# Patient Record
Sex: Female | Born: 1965 | Race: White | Hispanic: No | Marital: Single
Health system: Midwestern US, Academic
[De-identification: ages and names within clinical notes are randomized; demographics above are authoritative.]

---

## 2016-12-30 MED ORDER — OXYCODONE 5 MG PO TAB
5 mg | ORAL_TABLET | ORAL | 0 refills | PRN
Start: 2016-12-30 — End: ?

## 2016-12-30 MED ORDER — CLONAZEPAM 1 MG PO TAB
1 mg | ORAL_TABLET | Freq: Two times a day (BID) | ORAL | 0 refills | PRN
Start: 2016-12-30 — End: ?

## 2017-01-14 MED ORDER — CLONAZEPAM 1 MG PO TAB
1 mg | ORAL_TABLET | Freq: Two times a day (BID) | ORAL | 0 refills | PRN
Start: 2017-01-14 — End: ?

## 2017-01-14 MED ORDER — OXYCODONE 5 MG PO TAB
5 mg | ORAL_TABLET | ORAL | 0 refills | PRN
Start: 2017-01-14 — End: ?

## 2017-01-19 MED ORDER — CLONAZEPAM 1 MG PO TAB
1 mg | ORAL_TABLET | Freq: Two times a day (BID) | ORAL | 0 refills | Status: DC | PRN
Start: 2017-01-19 — End: 2017-03-11

## 2017-01-19 MED ORDER — FLUTICASONE 50 MCG/ACTUATION NA SPSN
1 | Freq: Two times a day (BID) | NASAL | 3 refills | 60.00000 days | Status: DC
Start: 2017-01-19 — End: 2017-02-08

## 2017-01-19 MED ORDER — OXYCODONE 5 MG PO TAB
5 mg | ORAL_TABLET | ORAL | 0 refills | 6.00000 days | Status: DC | PRN
Start: 2017-01-19 — End: 2017-07-07

## 2017-01-19 MED ORDER — OXYCODONE 5 MG PO TAB
5 mg | ORAL_TABLET | ORAL | 0 refills | 6.00000 days | Status: DC | PRN
Start: 2017-01-19 — End: 2017-01-19

## 2017-01-19 MED ORDER — DOXYCYCLINE HYCLATE 100 MG PO TAB
100 mg | ORAL_TABLET | Freq: Two times a day (BID) | ORAL | 0 refills | 8.00000 days | Status: AC
Start: 2017-01-19 — End: ?

## 2017-02-02 MED ORDER — LISINOPRIL-HYDROCHLOROTHIAZIDE 20-12.5 MG PO TAB
ORAL_TABLET | Freq: Every day | ORAL | 1 refills | 30.00000 days | Status: DC
Start: 2017-02-02 — End: 2017-05-03

## 2017-02-08 MED ORDER — AMITRIPTYLINE 25 MG PO TAB
ORAL_TABLET | 1 refills | Status: DC
Start: 2017-02-08 — End: 2017-05-05

## 2017-02-08 MED ORDER — AMITRIPTYLINE 50 MG PO TAB
ORAL_TABLET | 1 refills | Status: DC
Start: 2017-02-08 — End: 2017-05-05

## 2017-03-02 ENCOUNTER — Encounter: Admit: 2017-03-02 | Discharge: 2017-03-02

## 2017-03-02 NOTE — Telephone Encounter
VM left on pt's phone to call back regarding her hypotension.

## 2017-03-07 ENCOUNTER — Encounter: Admit: 2017-03-07 | Discharge: 2017-03-07

## 2017-03-07 NOTE — Telephone Encounter
Phone call to [facility] emergency line. Patient reports that she has had elevated blood pressures today with a high of 170/90. She had stopped taking her BP meds entirely over the past 4 days due to BPs consistently at goal when evaluated in office but has now taken them this morning after seeing her BP. No symptoms at this time.    Recommended continuation of BP meds as previously prescribed and follow-up with PCP in 1-2 weeks for dose adjustment.    Laray Anger MD  Family Medicine PGY3  Pager [phone]

## 2017-03-09 ENCOUNTER — Encounter: Admit: 2017-03-09 | Discharge: 2017-03-09

## 2017-03-09 DIAGNOSIS — F419 Anxiety disorder, unspecified: Principal | ICD-10-CM

## 2017-03-09 MED ORDER — CLONAZEPAM 1 MG PO TAB
ORAL_TABLET | Freq: Two times a day (BID) | 0 refills | Status: CN | PRN
Start: 2017-03-09 — End: ?

## 2017-03-09 NOTE — Telephone Encounter
Pt was last seen 01/19/2017.

## 2017-03-11 ENCOUNTER — Encounter: Admit: 2017-03-11 | Discharge: 2017-03-11

## 2017-03-11 DIAGNOSIS — F419 Anxiety disorder, unspecified: Principal | ICD-10-CM

## 2017-03-11 MED ORDER — CLONAZEPAM 1 MG PO TAB
1 mg | ORAL_TABLET | Freq: Two times a day (BID) | ORAL | 0 refills | Status: AC | PRN
Start: 2017-03-11 — End: 2017-05-05

## 2017-03-24 ENCOUNTER — Encounter: Admit: 2017-03-24 | Discharge: 2017-03-24

## 2017-03-24 NOTE — Telephone Encounter
Called to inform pt that orders were on file for a mammo. No answer, left number to imaging dept if pt wants to schedule an appt.

## 2017-03-25 ENCOUNTER — Encounter: Admit: 2017-03-25 | Discharge: 2017-03-25

## 2017-03-26 MED ORDER — PANTOPRAZOLE 40 MG PO TBEC
40 mg | ORAL_TABLET | Freq: Every day | ORAL | 0 refills | 90.00000 days | Status: AC
Start: 2017-03-26 — End: 2017-10-21

## 2017-04-02 ENCOUNTER — Encounter: Admit: 2017-04-02 | Discharge: 2017-04-02

## 2017-04-02 DIAGNOSIS — E119 Type 2 diabetes mellitus without complications: Principal | ICD-10-CM

## 2017-04-02 MED ORDER — GLIMEPIRIDE 2 MG PO TAB
2 mg | ORAL_TABLET | Freq: Two times a day (BID) | ORAL | 1 refills | Status: AC
Start: 2017-04-02 — End: 2017-04-19

## 2017-04-02 MED ORDER — GLIMEPIRIDE 1 MG PO TAB
2 mg | ORAL_TABLET | Freq: Two times a day (BID) | ORAL | 1 refills | Status: AC
Start: 2017-04-02 — End: 2017-04-02

## 2017-04-02 NOTE — Telephone Encounter
Pharmacy requesting to change glimepiride 1mg  (take 2 BID) to glimepiride 2mg  (take 1 BID).    New RX sent.

## 2017-04-02 NOTE — Telephone Encounter
Pt was last seen 01/19/17. Last physical was on 07/22/16. Sent medication to pharmacy with a 90 day supply and 1 refill.

## 2017-04-19 ENCOUNTER — Ambulatory Visit: Admit: 2017-04-19 | Discharge: 2017-04-20 | Payer: MEDICAID

## 2017-04-19 ENCOUNTER — Encounter: Admit: 2017-04-19 | Discharge: 2017-04-19

## 2017-04-19 DIAGNOSIS — A64 Unspecified sexually transmitted disease: ICD-10-CM

## 2017-04-19 DIAGNOSIS — C539 Malignant neoplasm of cervix uteri, unspecified: ICD-10-CM

## 2017-04-19 DIAGNOSIS — M25559 Pain in unspecified hip: ICD-10-CM

## 2017-04-19 DIAGNOSIS — M1990 Unspecified osteoarthritis, unspecified site: ICD-10-CM

## 2017-04-19 DIAGNOSIS — R51 Headache: ICD-10-CM

## 2017-04-19 DIAGNOSIS — J309 Allergic rhinitis, unspecified: ICD-10-CM

## 2017-04-19 DIAGNOSIS — F411 Generalized anxiety disorder: ICD-10-CM

## 2017-04-19 DIAGNOSIS — R52 Pain, unspecified: ICD-10-CM

## 2017-04-19 DIAGNOSIS — M542 Cervicalgia: ICD-10-CM

## 2017-04-19 DIAGNOSIS — I10 Essential (primary) hypertension: Principal | ICD-10-CM

## 2017-04-19 DIAGNOSIS — F429 Obsessive-compulsive disorder, unspecified: ICD-10-CM

## 2017-04-19 DIAGNOSIS — E785 Hyperlipidemia, unspecified: ICD-10-CM

## 2017-04-19 DIAGNOSIS — G4733 Obstructive sleep apnea (adult) (pediatric): ICD-10-CM

## 2017-04-19 DIAGNOSIS — K219 Gastro-esophageal reflux disease without esophagitis: ICD-10-CM

## 2017-04-19 DIAGNOSIS — E119 Type 2 diabetes mellitus without complications: ICD-10-CM

## 2017-04-19 DIAGNOSIS — F329 Major depressive disorder, single episode, unspecified: ICD-10-CM

## 2017-04-19 DIAGNOSIS — F419 Anxiety disorder, unspecified: ICD-10-CM

## 2017-04-19 DIAGNOSIS — T148XXA Other injury of unspecified body region, initial encounter: ICD-10-CM

## 2017-04-19 DIAGNOSIS — M5030 Other cervical disc degeneration, unspecified cervical region: ICD-10-CM

## 2017-04-19 DIAGNOSIS — F410 Panic disorder [episodic paroxysmal anxiety] without agoraphobia: ICD-10-CM

## 2017-04-19 DIAGNOSIS — F332 Major depressive disorder, recurrent severe without psychotic features: ICD-10-CM

## 2017-04-19 DIAGNOSIS — L409 Psoriasis, unspecified: Principal | ICD-10-CM

## 2017-04-19 MED ORDER — GLIMEPIRIDE 4 MG PO TAB
4 mg | ORAL_TABLET | Freq: Every day | ORAL | 1 refills | Status: AC
Start: 2017-04-19 — End: 2017-07-07

## 2017-04-19 MED ORDER — CETIRIZINE 10 MG PO TAB
10 mg | ORAL_TABLET | Freq: Every morning | ORAL | 3 refills | Status: AC
Start: 2017-04-19 — End: 2018-08-01

## 2017-04-19 NOTE — Progress Notes
Date of Service: 04/19/2017    Jessica Colon is a 51 y.o. female.    Subjective:             History of Present Illness    Chief Complaint   Patient presents with   ??? Hypertension     follow up     1. HTN  -had stressful commute on the way to appt today  -takes lisinopril 20/12.5 mg daily. Was taking 2 pills daily but BP was running low    2. DM2  -has not been adhering to DM diet lately  -has been walking more  A1c 8.7 today  -currently taking glimepiride 3 mg daily. Has not been taking meds regularly  -did not tolerate MTF    3. Depression/anxiety  -has had increase in anxiety attacks  -followed by psychiatry  -has appt in 1 mo    4. Neck pain  -causing tingling in bilateral hands  -MRI c-spine: severe cervical stenosis. S/p cervical spine surgery  -takes cymbalta 120 mg daily    5. Allergic rhinitis  -has not tried allergy meds         Review of Systems   HENT: Positive for congestion, postnasal drip and rhinorrhea. Negative for sinus pain and sinus pressure.    Respiratory: Positive for cough. Negative for shortness of breath.    Cardiovascular: Negative for chest pain, palpitations and leg swelling.   Endocrine: Negative for polydipsia, polyphagia and polyuria.   Musculoskeletal: Positive for back pain and neck pain. Negative for arthralgias, gait problem, myalgias and neck stiffness.   Neurological: Positive for numbness. Negative for dizziness, weakness, light-headedness and headaches.         Objective:         ??? acetaminophen (TYLENOL) 325 mg tablet Take 2 Tabs by mouth every 4 hours as needed.   ??? amitriptyline (ELAVIL) 25 mg tablet Take 1 tablet in combination with 50mg  for total dose of 75mg  at night   ??? amitriptyline (ELAVIL) 50 mg tablet Take 1 tablet in combination with 25mg  for total dose of 75mg  at night   ??? blood sugar diagnostic test strip Use 1 strip as directed before meals and at bedtime.   ??? Cholecalciferol (Vitamin D3) (VITAMIN D) 1,000 unit cap Take 2 Caps by mouth daily. ??? clobetasol (TEMOVATE) 0.05 % topical cream Apply  topically to affected area twice daily.   ??? clonazePAM (KLONOPIN) 1 mg tablet Take 1 tablet by mouth twice daily as needed (anxiety).   ??? DULoxetine DR (CYMBALTA) 60 mg capsule Take 120 mg by mouth daily.   ??? glimepiride (AMARYL) 2 mg tablet Take 1 tablet by mouth twice daily.   ??? lancets MISC Use 1 each as directed four times daily as needed. Diag: DM2   ??? lisinopril-hydrochlorothiazide (PRINZIDE, ZESTORETIC) 20-12.5 mg tablet TAKE TWO TABLETS BY MOUTH ONCE DAILY   ??? lisinopril/hydrochlorothiazide (ZESTORETIC) 20/12.5 mg tablet Take 2 Tabs by mouth daily.   ??? methocarbamol (ROBAXIN) 500 mg tablet Take 1 Tab by mouth four times daily as needed for Spasms.   ??? naproxen (NAPROSYN) 500 mg tablet Take 1 tablet by mouth twice daily with meals. Take with food.   ??? oxyCODONE (ROXICODONE, OXY-IR) 5 mg tablet Take 1 tablet by mouth every 4 hours as needed for Pain   ??? pantoprazole DR (PROTONIX) 40 mg tablet Take 1 tablet by mouth daily.     Vitals:    04/19/17 1502   BP: (!) 144/92   Pulse: 103  Resp: 16   Temp: 36.8 ???C (98.2 ???F)   TempSrc: Oral   SpO2: 98%   Weight: 103.6 kg (228 lb 4.8 oz)   Height: 170.2 cm (67)     Body mass index is 35.76 kg/m???.     Physical Exam  Constitutional: Alert, well nourished, in no distress.    Eyes:  EOMI.  Conjunctiva are non-icteric and are no injected.  ENT: External ear canals are negative.  TM's are clear bilat, with no erythema.  Oropharynx is clear, with no exudates or ulcers seen.   Sinuses are nontender to palpation bilat.    CV: Heart exam shows regular rhythm, no murmur  RESP: Lungs are clear to auscultation bilat, with no rales, rhonchi, or wheezing.  MS: no joint swelling or erythema.  NEURO:  CN's 2-12 intact; strength is equal bilat. +Hoffman on R hand  PSYCH:  Good eye contact, normal affect.             Assessment and Plan:  Jessica Colon was seen today for hypertension. Diagnoses and all orders for this visit:    Type 2 diabetes mellitus without complication, without long-term current use of insulin (HCC)  -DM out of control  -increase dose of glimepride to 4 mg daily  -encourage DM diet and exercise  -     POC HEMOGLOBIN A1C  -     glimepiride (AMARYL) 4 mg tablet; Take 1 tablet by mouth daily with breakfast.    Psoriasis  -     AMB REFERRAL TO DERMATOLOGY    Neck pain  -pt has h/o spinal stenosis s/p cervical spine surgery  -now experiencing recurrent neck pain/HA and bilateral numbness/tingling  -check CT spine to eval for recurrent stensosis. Refer to ortho spine pending results  -cont cymbalta  -     CT SPINE CERVICAL WO CONTRAST; Future; Expected date: 04/19/2017    Essential hypertension  -BP above goal  -pt advised to cont meds at current dose  -rtc in 4 wks for BP check    Allergic rhinitis, unspecified seasonality, unspecified trigger  -try zyrtec/flonase prn  -     cetirizine (ZYRTEC) 10 mg tablet; Take 1 tablet by mouth every morning.    GAD (generalized anxiety disorder)  -followed by psych  -cont meds per psych recs    Severe episode of recurrent major depressive disorder, without psychotic features (HCC)  -followed by psych  -cont meds per psych recs

## 2017-05-03 ENCOUNTER — Encounter: Admit: 2017-05-03 | Discharge: 2017-05-03

## 2017-05-03 MED ORDER — LISINOPRIL-HYDROCHLOROTHIAZIDE 20-12.5 MG PO TAB
ORAL_TABLET | Freq: Every day | ORAL | 0 refills | 30.00000 days | Status: AC
Start: 2017-05-03 — End: 2017-06-03

## 2017-05-03 NOTE — Telephone Encounter
Pt was last seen 04/19/17. Last physical was on 07/22/16. Sent 90 day supply to pharmacy with no refills.

## 2017-05-04 ENCOUNTER — Encounter: Admit: 2017-05-04 | Discharge: 2017-05-04

## 2017-05-04 DIAGNOSIS — F419 Anxiety disorder, unspecified: Principal | ICD-10-CM

## 2017-05-04 NOTE — Telephone Encounter
Patient called today to reschedule yesterday's missed appointment, states her transportation fell through. Patient states [facility] in [facility] faxed over request for amitiptyline and clonazepam, but they haven't received a response. Follow up scheduled for 8/30.

## 2017-05-05 MED ORDER — CLONAZEPAM 1 MG PO TAB
1 mg | ORAL_TABLET | Freq: Two times a day (BID) | ORAL | 0 refills | Status: AC | PRN
Start: 2017-05-05 — End: 2017-05-27

## 2017-05-05 MED ORDER — AMITRIPTYLINE 25 MG PO TAB
ORAL_TABLET | 0 refills | Status: AC
Start: 2017-05-05 — End: 2017-05-27

## 2017-05-05 MED ORDER — AMITRIPTYLINE 50 MG PO TAB
ORAL_TABLET | 0 refills | Status: AC
Start: 2017-05-05 — End: 2017-05-27

## 2017-05-05 NOTE — Telephone Encounter
Have not received any faxes for this patient for med refills, but will refill given she has appt scheduled. JLK

## 2017-05-17 ENCOUNTER — Ambulatory Visit: Admit: 2017-05-17 | Discharge: 2017-05-18 | Payer: MEDICAID

## 2017-05-17 ENCOUNTER — Encounter: Admit: 2017-05-17 | Discharge: 2017-05-17

## 2017-05-17 ENCOUNTER — Ambulatory Visit: Admit: 2017-05-17 | Discharge: 2017-05-17 | Payer: MEDICAID

## 2017-05-17 DIAGNOSIS — F419 Anxiety disorder, unspecified: ICD-10-CM

## 2017-05-17 DIAGNOSIS — A64 Unspecified sexually transmitted disease: ICD-10-CM

## 2017-05-17 DIAGNOSIS — E119 Type 2 diabetes mellitus without complications: ICD-10-CM

## 2017-05-17 DIAGNOSIS — K219 Gastro-esophageal reflux disease without esophagitis: ICD-10-CM

## 2017-05-17 DIAGNOSIS — K047 Periapical abscess without sinus: ICD-10-CM

## 2017-05-17 DIAGNOSIS — T148XXA Other injury of unspecified body region, initial encounter: ICD-10-CM

## 2017-05-17 DIAGNOSIS — C539 Malignant neoplasm of cervix uteri, unspecified: ICD-10-CM

## 2017-05-17 DIAGNOSIS — M5030 Other cervical disc degeneration, unspecified cervical region: ICD-10-CM

## 2017-05-17 DIAGNOSIS — G4733 Obstructive sleep apnea (adult) (pediatric): ICD-10-CM

## 2017-05-17 DIAGNOSIS — E785 Hyperlipidemia, unspecified: ICD-10-CM

## 2017-05-17 DIAGNOSIS — F410 Panic disorder [episodic paroxysmal anxiety] without agoraphobia: ICD-10-CM

## 2017-05-17 DIAGNOSIS — J0100 Acute maxillary sinusitis, unspecified: Principal | ICD-10-CM

## 2017-05-17 DIAGNOSIS — I10 Essential (primary) hypertension: Principal | ICD-10-CM

## 2017-05-17 DIAGNOSIS — F329 Major depressive disorder, single episode, unspecified: ICD-10-CM

## 2017-05-17 DIAGNOSIS — Z1231 Encounter for screening mammogram for malignant neoplasm of breast: Principal | ICD-10-CM

## 2017-05-17 DIAGNOSIS — F429 Obsessive-compulsive disorder, unspecified: ICD-10-CM

## 2017-05-17 DIAGNOSIS — M25559 Pain in unspecified hip: ICD-10-CM

## 2017-05-17 DIAGNOSIS — M1990 Unspecified osteoarthritis, unspecified site: ICD-10-CM

## 2017-05-17 DIAGNOSIS — R51 Headache: ICD-10-CM

## 2017-05-17 DIAGNOSIS — R52 Pain, unspecified: ICD-10-CM

## 2017-05-17 MED ORDER — DOXYCYCLINE MONOHYDRATE 100 MG PO TAB
100 mg | ORAL_TABLET | Freq: Two times a day (BID) | ORAL | 0 refills | 8.00000 days | Status: AC
Start: 2017-05-17 — End: ?

## 2017-05-17 NOTE — Progress Notes
Date of Service: 05/17/2017    Jessica Colon is a 51 y.o. female.    Subjective:             History of Present Illness    Chief Complaint   Patient presents with   ??? Follow Up     BP   ??? Bronchitis     ?, sore throat, drainage, coughing-since 1 week ago     1. HTN  -BP at goal today  Hypertension Management:  Medication adherent: all of the time    Treatment goal: 140/90  Outside blood pressures being performed: Yes  BP Readings from Last 3 Encounters:   05/17/17 104/78   04/19/17 (!) 144/92   04/06/17 130/90     She denies significant light-headedness.  Imp: Hypertension controlled     Plan:   Discussed hypertension and reviewed goals.  Are barriers to achieving goals present? No  Medication education provided. Patient voiced understanding? Yes  Patient able to self-manage and ready to comply? Yes  Educational resources identified? Verbal Counseling      2. DM2  -has been trying to reduce carbs  Diabetes Management:  Diet adherent: most of the time  Medication adherent: all of the time  Patient is consistent with home glucose monitoring: No  The patient has not had hypoglycemic reactions  Lab Results   Component Value Date/Time    HGBA1C 7.6 (H) 11/11/2016 03:29 PM    HGBA1C 7.9 (H) 08/17/2016 03:54 PM    A1C 8.7 04/19/2017    CHOL 140 08/17/2016 03:54 PM    TRIG 229 (H) 08/17/2016 03:54 PM    HDL 33 (L) 08/17/2016 03:54 PM    LDL 86 08/17/2016 03:54 PM    VLDL 46 08/17/2016 03:54 PM    NONHDLCHOL 107 08/17/2016 03:54 PM    CR 0.93 08/17/2016 03:54 PM    MCALBR <7.0 08/17/2016 04:30 PM       Microalbumin tested in last 12 months?  Yes  Eye exam within the last 12 months? Yes  Comprehensive Foot exam within the last 12 months? Yes  Pneumonia shot current? Yes  The patient is taking a daily aspirin:Not Indicated  The patient is taking an ACE inhibitor or an ARB:Yes   The patient is taking a statin:Yes  Impression: Diabetes - under fair control    Plan: Discussed general issues about diabetes pathophysiology and management.  Discussed exercise management and diet with emphasis on vegetables, fruit and lean meat.  Discussed foot care.  Reminded to get retinal exam annually and dental appointment every 6 months.  Treatment goals: A1C < or = 6.5       BP <140/90  Are barriers to achieving goals present? No  Medication education provided. Patient voiced understanding? Yes  Patient able to self-manage and ready to comply? Yes  Educational resources identified? Verbal Counseling    3. Cough  -+congestion/rhinorrhea/cough productive of yellow/green sputum    4. Tooth infection  -recently had back molars removed  -still has swelling of gums on upper left side         Review of Systems   Constitutional: Positive for fatigue. Negative for chills, diaphoresis and fever.   HENT: Positive for congestion, dental problem, postnasal drip, rhinorrhea and sore throat. Negative for sinus pressure and sneezing.    Respiratory: Positive for cough. Negative for wheezing.    Cardiovascular: Negative for chest pain, palpitations and leg swelling.   Endocrine: Negative for polydipsia, polyphagia and polyuria.  Neurological: Negative for dizziness, light-headedness and headaches.         Objective:         ??? acetaminophen (TYLENOL) 325 mg tablet Take 2 Tabs by mouth every 4 hours as needed.   ??? amitriptyline (ELAVIL) 25 mg tablet Take 1 tablet in combination with 50mg  for total dose of 75mg  at night   ??? amitriptyline (ELAVIL) 50 mg tablet Take 1 tablet in combination with 25mg  for total dose of 75mg  at night   ??? blood sugar diagnostic test strip Use 1 strip as directed before meals and at bedtime.   ??? cetirizine (ZYRTEC) 10 mg tablet Take 1 tablet by mouth every morning.   ??? Cholecalciferol (Vitamin D3) (VITAMIN D) 1,000 unit cap Take 2 Caps by mouth daily.   ??? clobetasol (TEMOVATE) 0.05 % topical cream Apply  topically to affected area twice daily. ??? clonazePAM (KLONOPIN) 1 mg tablet Take 1 tablet by mouth twice daily as needed (anxiety).   ??? DULoxetine DR (CYMBALTA) 60 mg capsule Take 120 mg by mouth daily.   ??? glimepiride (AMARYL) 4 mg tablet Take 1 tablet by mouth daily with breakfast.   ??? lancets MISC Use 1 each as directed four times daily as needed. Diag: DM2   ??? lisinopril-hydrochlorothiazide (PRINZIDE, ZESTORETIC) 20-12.5 mg tablet TAKE 2 TABLETS BY MOUTH ONCE DAILY   ??? lisinopril/hydrochlorothiazide (ZESTORETIC) 20/12.5 mg tablet Take 2 Tabs by mouth daily.   ??? methocarbamol (ROBAXIN) 500 mg tablet Take 1 Tab by mouth four times daily as needed for Spasms.   ??? oxyCODONE (ROXICODONE, OXY-IR) 5 mg tablet Take 1 tablet by mouth every 4 hours as needed for Pain   ??? pantoprazole DR (PROTONIX) 40 mg tablet Take 1 tablet by mouth daily.     Vitals:    05/17/17 1309   BP: 104/78   Pulse: 113   Resp: 16   Temp: 37.7 ???C (99.9 ???F)   SpO2: 97%   Weight: 101.5 kg (223 lb 12.8 oz)   Height: 170.2 cm (67)     Body mass index is 35.05 kg/m???.     Physical Exam  Constitutional: Alert, well nourished, in no distress.    Eyes:  PERRLA, EOMI.  Conjunctiva are non-icteric and are no injected.  ENT: External ear canals are negative.  TM's are clear bilat, with no erythema.  Oropharynx is clear, with no exudates or ulcers seen.   Sinuses are nontender to palpation bilat.  Swelling and erythema to L upper mandible.  Piece of tooth showing at back of mouth  CV: Heart exam shows regular rhythm, no murmur  RESP: +wheezing in LLL and RUL  NEURO:  CN's 2-12 intact  PSYCH:  Good eye contact, normal affect.             Assessment and Plan:  Jessica Colon was seen today for follow up and bronchitis.    Diagnoses and all orders for this visit:    Acute non-recurrent maxillary sinusitis  -tx with doxycyline for sinusitis and potential LRTI  -cont symptomatic management  -return precautions given  -     doxycycline (ADOXA) 100 mg tablet; Take one tablet by mouth twice daily for 10 days.    Tooth infection  -tx with abx as above  -pt advised to f/u with dentist     Essential hypertension  -BP at goal today  -pt advised to take 1 tab daily of lisinpril/HCTZ  -rct in 1 mo for BP check  -consider reducing  dose of meds if BP remains low    Type 2 diabetes mellitus without complication, without long-term current use of insulin (HCC)  -A1c above goal  -pt does not want to increase dose of meds. Will cont working on diet/exercise  -plan to adjust meds at next appt if A1c still above goal

## 2017-05-27 ENCOUNTER — Ambulatory Visit: Admit: 2017-05-27 | Discharge: 2017-05-27 | Payer: MEDICAID

## 2017-05-27 DIAGNOSIS — F411 Generalized anxiety disorder: Principal | ICD-10-CM

## 2017-05-27 DIAGNOSIS — F331 Major depressive disorder, recurrent, moderate: ICD-10-CM

## 2017-05-27 DIAGNOSIS — M542 Cervicalgia: Principal | ICD-10-CM

## 2017-05-27 DIAGNOSIS — F3341 Major depressive disorder, recurrent, in partial remission: ICD-10-CM

## 2017-05-27 MED ORDER — BUSPIRONE 10 MG PO TAB
10 mg | ORAL_TABLET | Freq: Two times a day (BID) | ORAL | 1 refills | Status: AC
Start: 2017-05-27 — End: 2017-07-29

## 2017-05-27 MED ORDER — DULOXETINE 60 MG PO CPDR
120 mg | ORAL_CAPSULE | Freq: Every day | ORAL | 2 refills | 60.00000 days | Status: AC
Start: 2017-05-27 — End: 2017-07-29

## 2017-05-27 MED ORDER — CLONAZEPAM 1 MG PO TAB
ORAL_TABLET | Freq: Every evening | 1 refills | Status: AC
Start: 2017-05-27 — End: 2017-07-29
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
## 2017-05-28 ENCOUNTER — Encounter: Admit: 2017-05-28 | Discharge: 2017-05-28

## 2017-05-28 DIAGNOSIS — 1 ERRONEOUS ENCOUNTER--DISREGARD: Principal | ICD-10-CM

## 2017-06-01 ENCOUNTER — Encounter: Admit: 2017-06-01 | Discharge: 2017-06-01

## 2017-06-01 DIAGNOSIS — M4802 Spinal stenosis, cervical region: Principal | ICD-10-CM

## 2017-06-01 DIAGNOSIS — M4712 Other spondylosis with myelopathy, cervical region: ICD-10-CM

## 2017-06-01 NOTE — Telephone Encounter
Spoke with pt and advised of Ct results and of Dr.Singh's recommendations. Referral process explained to pt and she v/u

## 2017-06-03 ENCOUNTER — Encounter: Admit: 2017-06-03 | Discharge: 2017-06-03

## 2017-06-03 MED ORDER — LISINOPRIL-HYDROCHLOROTHIAZIDE 20-12.5 MG PO TAB
2 | ORAL_TABLET | Freq: Every day | ORAL | 0 refills | 30.00000 days | Status: AC
Start: 2017-06-03 — End: 2017-10-01

## 2017-06-07 ENCOUNTER — Ambulatory Visit: Admit: 2017-06-07 | Discharge: 2017-06-08 | Payer: MEDICAID

## 2017-06-07 ENCOUNTER — Encounter: Admit: 2017-06-07 | Discharge: 2017-06-07

## 2017-06-07 DIAGNOSIS — A64 Unspecified sexually transmitted disease: ICD-10-CM

## 2017-06-07 DIAGNOSIS — E119 Type 2 diabetes mellitus without complications: ICD-10-CM

## 2017-06-07 DIAGNOSIS — G4733 Obstructive sleep apnea (adult) (pediatric): ICD-10-CM

## 2017-06-07 DIAGNOSIS — M5030 Other cervical disc degeneration, unspecified cervical region: ICD-10-CM

## 2017-06-07 DIAGNOSIS — E785 Hyperlipidemia, unspecified: ICD-10-CM

## 2017-06-07 DIAGNOSIS — C539 Malignant neoplasm of cervix uteri, unspecified: ICD-10-CM

## 2017-06-07 DIAGNOSIS — F419 Anxiety disorder, unspecified: ICD-10-CM

## 2017-06-07 DIAGNOSIS — F410 Panic disorder [episodic paroxysmal anxiety] without agoraphobia: ICD-10-CM

## 2017-06-07 DIAGNOSIS — T148XXA Other injury of unspecified body region, initial encounter: ICD-10-CM

## 2017-06-07 DIAGNOSIS — R51 Headache: ICD-10-CM

## 2017-06-07 DIAGNOSIS — K219 Gastro-esophageal reflux disease without esophagitis: ICD-10-CM

## 2017-06-07 DIAGNOSIS — I10 Essential (primary) hypertension: Principal | ICD-10-CM

## 2017-06-07 DIAGNOSIS — M1990 Unspecified osteoarthritis, unspecified site: ICD-10-CM

## 2017-06-07 DIAGNOSIS — F429 Obsessive-compulsive disorder, unspecified: ICD-10-CM

## 2017-06-07 DIAGNOSIS — R52 Pain, unspecified: ICD-10-CM

## 2017-06-07 DIAGNOSIS — M25559 Pain in unspecified hip: ICD-10-CM

## 2017-06-07 DIAGNOSIS — F329 Major depressive disorder, single episode, unspecified: ICD-10-CM

## 2017-06-07 MED ORDER — GABAPENTIN 300 MG PO CAP
ORAL_CAPSULE | Freq: Two times a day (BID) | 3 refills | Status: AC
Start: 2017-06-07 — End: 2017-07-07

## 2017-06-07 NOTE — Telephone Encounter
RN received v/m from patient who stated her daughters have told her that she has "made the wrong decision" and that "she will not be able to do what Dr. Tamala Julian has asked her to do" so she has decided for now "not to do anything". Patient asked for her future appointments to be cancelled for now.

## 2017-06-07 NOTE — Telephone Encounter
Patient's daughter called and would like to speak to you in regards to the patient and her behavior and being serious about getting true help. Please give her a call because she would like to tell you how her mother is acting. Please give her a call. Thank you

## 2017-06-07 NOTE — Progress Notes
[facility]INE [facility] HISTORY AND PHYSICAL    Chief Complaint   Patient presents with   ??? Neck - Pain       Subjective     HISTORY OF PRESENT ILLNESS:  Ms. Jeb Levering is a 51 year old female with history of hypertension, diabetes and cervical cancer, and C5-7 ACDF by Dr. Debroah Loop on 11/18/2015, presents with increasing neck pain with radiation in the right greater than the left upper extremity.  Patient was last seen in July 2016, at which point she was diagnosed with cervical myelopathy and referred to Dr. Debroah Loop.  She underwent ACDF in February 2017.  She reports temporary relief of symptoms.  She reported persistent numbness of the right upper extremity.  She states her pain and numbness have increased.  She does report increasing weakness.  She has had a recent CT scan which demonstrated degenerative changes at C5-C6 and bony neural foraminal stenosis.  VAS pain score is rated as a 5/10.  She denies loss of bowel or bladder function.  She denies balance difficulty.  She denies recent falls.  She is unsure what makes her pain better or worse.  The quality is aching, throbbing, and shooting.  Temporally, symptoms are constant, but fluctuates with intensity.  She has had some physical therapy without significant benefit.  She has tried some medications with temporary relief.         Past Medical History:   Diagnosis Date   ??? Anxiety    ??? Arthritis    ??? 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 Chronic generalized pain    ??? DDD (degenerative disc disease), cervical    ??? Depression     Depression and Anxiety   ??? DM2 (diabetes mellitus, type 2) (HCC)    ??? Generalized headaches    ??? GERD (gastroesophageal reflux disease)    ??? Hip pain    ??? HLD (hyperlipidemia)    ??? HTN (hypertension)    ??? Nerve injury    ??? OCD (obsessive compulsive disorder)    ??? OSA (obstructive sleep apnea)    ??? Panic attacks    ??? Sexually transmitted disease     HX of HPV       Past Surgical History:   Procedure Laterality Date   ??? TONSILLECTOMY  1973 ??? CHOLECYSTECTOMY  1990   ??? TUBAL LIGATION  1991   ??? PR ARTHRD ANT INTERBODY DECOMPRESS CERVICAL BELW C2 N/A 11/18/2015    ANTERIOR CERVICAL 5-7 FUSION performed by Catarina Hartshorn, [facility]   ??? CERVICAL SPINE SURGERY N/A 11/18/2015    VERTEBRECTOMY CERVICAL 6 performed by Catarina Hartshorn, MD at [facility]   ??? HX TOTAL ABDOMINAL HYSTERECTOMY      2/2 cervical ca       family history includes Arthritis in her mother; Cancer in her maternal grandfather; Diabetes in her maternal grandfather; Heart problem in her brother and father; Hypertension in her mother; Joint Pain in her mother; Lung Disease in her brother.    Social History     Social History   ??? Marital status: Single     Spouse name: N/A   ??? Number of children: 2   ??? Years of education: N/A     Occupational History   ??? Not on file.     Social History Main Topics   ??? Smoking status: Current Every Day Smoker     Packs/day: 1.00     Years: 38.00  Types: Cigarettes   ??? Smokeless tobacco: Never Used      Comment: pt declined   ??? Alcohol use No   ??? Drug use: No   ??? Sexual activity: Not Currently     Partners: Male     Birth control/ protection: Surgical      Comment: Hysterectomy     Other Topics Concern   ??? Not on file     Social History Narrative    Lives alone in [place] housing.  No DV.  Feels safe.  Currently not working.  On SSI       Allergies   Allergen Reactions   ??? Pcn [Penicillins] ANAPHYLAXIS     Pt reports it'll kill me, rapid heart rate and I won't breathe.   ??? Clindamycin EDEMA   ??? Hydrocodone SEE COMMENTS     Pt states it makes me feel strange, like I'm out of control of myself.   ??? Niacin SEE COMMENTS     Pt states I get really really hot and I feel strange.   ??? Wellbutrin [Bupropion] ITCHING       Current Outpatient Prescriptions on File Prior to Visit   Medication Sig Dispense Refill   ??? acetaminophen (TYLENOL) 325 mg tablet Take 2 Tabs by mouth every 4 hours as needed.  0 ??? blood sugar diagnostic test strip Use 1 strip as directed before meals and at bedtime. 300 strip 3   ??? busPIRone (BUSPAR) 10 mg tablet Take one tablet by mouth twice daily. 60 tablet 1   ??? cetirizine (ZYRTEC) 10 mg tablet Take 1 tablet by mouth every morning. 30 tablet 3   ??? Cholecalciferol (Vitamin D3) (VITAMIN D) 1,000 unit cap Take 2 Caps by mouth daily.     ??? clobetasol (TEMOVATE) 0.05 % topical cream Apply  topically to affected area twice daily. 60 g 1   ??? clonazePAM (KLONOPIN) 1 mg tablet Take 1 tab at bedtime for sleep. May take additional 1 tab daily for severe anxiety. 60 tablet 1   ??? duloxetine DR (CYMBALTA) 60 mg capsule Take two capsules by mouth daily. 60 capsule 2   ??? glimepiride (AMARYL) 4 mg tablet Take 1 tablet by mouth daily with breakfast. 90 tablet 1   ??? lancets MISC Use 1 each as directed four times daily as needed. Diag: DM2 300 each 11   ??? lisinopril-hydrochlorothiazide (PRINZIDE, ZESTORETIC) 20-12.5 mg tablet Take two tablets by mouth daily. 180 tablet 0   ??? oxyCODONE (ROXICODONE, OXY-IR) 5 mg tablet Take 1 tablet by mouth every 4 hours as needed for Pain 30 tablet 0   ??? pantoprazole DR (PROTONIX) 40 mg tablet Take 1 tablet by mouth daily. 90 tablet 0     No current facility-administered medications on file prior to visit.        Vitals:    06/07/17 1044   BP: (!) 136/106   Pulse: 97   Resp: 20   SpO2: 98%   Weight: 101.2 kg (223 lb)   Height: 170.2 cm (67)       Oswestry Total Score:: 36    No Data Recorded  Is a controlled substance agreement on file?No    Pain Score: Five    Body mass index is 34.93 kg/m???.    Review of Systems   Constitutional: Positive for activity change, appetite change and fatigue.   HENT: Positive for congestion and dental problem.    Eyes: Positive for itching.   Respiratory: Positive for cough.  Endocrine: Positive for heat intolerance.   Musculoskeletal: Positive for arthralgias, back pain, neck pain and neck stiffness. Neurological: Positive for dizziness, weakness and numbness.   Psychiatric/Behavioral: Positive for self-injury. The patient is nervous/anxious.    All other systems reviewed and are negative.           PHYSICAL EXAM:    General: 51 y.o. female appears stated age, in no acute distress  HEENT: Normocephalic, atraumatic  Neck: No thyroidmegaly  Cardiovascular: Well perfused  Pulmonary: Unlabored respirations  Extremities: No cyanosis, clubbing, or edema  Skin: Warm and dry  Psychiatric:  Appropriate mood and affect  Musculoskeletal: Decreased range of motion of cervical extension and lateral rotation.  Tender palpation at cervical paraspinals and trapezius.  Facet loading is positive bilaterally.  Neurologic: Right C6 myotome is 4/5, otherwise upper extremity myotomes are all 5/5.  Right C6 dermatome is impaired to light touch, otherwise upper extremity dermatomes are all intact to light touch.    Negative Hoffman's.  Positive Spurlings. No ankle clonus.  Downward Babinski.       RADIOGRAPHIC EVALUATION:  CT scan cervical spine from 05/27/2017 was personally reviewed and demonstrated posterior this osteophytes resulting in moderate central canal and severe right, moderate left neural foraminal stenosis.  There is C5-C7 ACDF present.    IMPRESSION:    1. Chronic neck pain    2. History of fusion of cervical spine    3. Cervical radiculopathy    4. Arthropathy of cervical facet joint    Ms. Dixie Dials is a 51 year old female with history of C5-C7 by Dr. Debroah Loop in December 2017, who presents with increasing neck pain with radiation to the right upper extremity.  History and physical examination are consistent with C6 radiculopathy in setting of cervical facet arthropathy and prior fusion.      PLAN:   1.  Lifestyle modification.  Recommend keeping spine in neutral position.  2.  Medication.  I recommend initiation of gabapentin 300 mg 3 times a day.  She was provided with titration schedule. 3.  Therapy.  I would recommend formalized physical therapy.  She is she was provided with prescription today.  4.  Interventions.  Recommend C6-C7 interlaminar epidural steroid injection.  5.  Referral.  May consider surgical referral if pain is unimproved with conservative measures.  6.  Follow-up.  Patient is to follow-up for procedure.

## 2017-06-08 ENCOUNTER — Encounter: Admit: 2017-06-08 | Discharge: 2017-06-08

## 2017-06-08 DIAGNOSIS — M542 Cervicalgia: Principal | ICD-10-CM

## 2017-06-08 DIAGNOSIS — G8929 Other chronic pain: ICD-10-CM

## 2017-06-08 DIAGNOSIS — M4712 Other spondylosis with myelopathy, cervical region: ICD-10-CM

## 2017-06-08 DIAGNOSIS — M5412 Radiculopathy, cervical region: ICD-10-CM

## 2017-06-08 DIAGNOSIS — M4802 Spinal stenosis, cervical region: ICD-10-CM
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
## 2017-06-17 ENCOUNTER — Ambulatory Visit: Admit: 2017-06-17 | Discharge: 2017-06-17 | Payer: MEDICAID

## 2017-06-24 ENCOUNTER — Encounter: Admit: 2017-06-24 | Discharge: 2017-06-24

## 2017-06-24 NOTE — Telephone Encounter
RN received v/m from patient stated she has questions about the procedure & recovery for the injection she is scheduled to received Monday 10/1.    RN returned call, v/m identified by 1st & last name, left message asked patient to return call to [phone].

## 2017-06-25 NOTE — Telephone Encounter
Patient returned call, RN explained process of preprocedure, injection & recovery - discussed patient may have immediate pain relief from local anesthetic, this will likely wear off in a few hours to a few days and patient may have increase in pain before she feels betters as it is possible to have an inflammatory response to the steroid, recommended patient give the steroid 3 full weeks to assess benefit. Ensured patient Dr. Tamala Julian and staff will walk patient through each step, she will see Dr. Tamala Julian prior to injection to ask questions & sign consents.  Patient v/u and was thankful for the time given to discuss her concerns.  Patient denies further needs at this time.

## 2017-06-28 ENCOUNTER — Encounter: Admit: 2017-06-28 | Discharge: 2017-06-28

## 2017-06-28 DIAGNOSIS — M5412 Radiculopathy, cervical region: Principal | ICD-10-CM

## 2017-06-28 NOTE — Telephone Encounter
RN received v/m from patient, said she has had allergic reaction to Gabapentin, been taking approximately 3 weeks in last several days developed red "spots but not hives" and severe itching, was so bad Sat. she discontinued the Gabapentin & began taking Benadryl - since this time the red spots have "diminished" and the itching is gone.     RN returned call, confirmed the patient has stopped taking Gabapentin and is feeling better.  Patient is interested in trying another medication, RN will route message to Dr. Tamala Julian for recommendation & follow up with patient by phone.    RN also let patient know her CESI C6-C7 C7-T1 scheduled Friday 10/5 is currently being denied by insurance due to lack of 6 weeks of PT.  Patient said she completed some PT last year at [facility] (chart reviews shows some PT completed in March 2017 for cerv radiculopathy ordered by Dr. Roselie Awkward).

## 2017-06-29 ENCOUNTER — Ambulatory Visit: Admit: 2017-06-29 | Discharge: 2017-06-29 | Payer: MEDICAID

## 2017-07-07 ENCOUNTER — Encounter: Admit: 2017-07-07 | Discharge: 2017-07-07

## 2017-07-07 ENCOUNTER — Ambulatory Visit: Admit: 2017-07-07 | Discharge: 2017-07-08 | Payer: MEDICAID

## 2017-07-07 ENCOUNTER — Ambulatory Visit: Admit: 2017-07-07 | Discharge: 2017-07-07 | Payer: MEDICAID

## 2017-07-07 DIAGNOSIS — Z594 Lack of adequate food and safe drinking water: ICD-10-CM

## 2017-07-07 DIAGNOSIS — F419 Anxiety disorder, unspecified: ICD-10-CM

## 2017-07-07 DIAGNOSIS — M5030 Other cervical disc degeneration, unspecified cervical region: ICD-10-CM

## 2017-07-07 DIAGNOSIS — R52 Pain, unspecified: ICD-10-CM

## 2017-07-07 DIAGNOSIS — A64 Unspecified sexually transmitted disease: ICD-10-CM

## 2017-07-07 DIAGNOSIS — R05 Cough: Principal | ICD-10-CM

## 2017-07-07 DIAGNOSIS — I10 Essential (primary) hypertension: Principal | ICD-10-CM

## 2017-07-07 DIAGNOSIS — M1990 Unspecified osteoarthritis, unspecified site: ICD-10-CM

## 2017-07-07 DIAGNOSIS — M25559 Pain in unspecified hip: ICD-10-CM

## 2017-07-07 DIAGNOSIS — E119 Type 2 diabetes mellitus without complications: ICD-10-CM

## 2017-07-07 DIAGNOSIS — G8929 Other chronic pain: Principal | ICD-10-CM

## 2017-07-07 DIAGNOSIS — F429 Obsessive-compulsive disorder, unspecified: ICD-10-CM

## 2017-07-07 DIAGNOSIS — K219 Gastro-esophageal reflux disease without esophagitis: ICD-10-CM

## 2017-07-07 DIAGNOSIS — F332 Major depressive disorder, recurrent severe without psychotic features: ICD-10-CM

## 2017-07-07 DIAGNOSIS — F329 Major depressive disorder, single episode, unspecified: ICD-10-CM

## 2017-07-07 DIAGNOSIS — E785 Hyperlipidemia, unspecified: ICD-10-CM

## 2017-07-07 DIAGNOSIS — C539 Malignant neoplasm of cervix uteri, unspecified: ICD-10-CM

## 2017-07-07 DIAGNOSIS — T148XXA Other injury of unspecified body region, initial encounter: ICD-10-CM

## 2017-07-07 DIAGNOSIS — F410 Panic disorder [episodic paroxysmal anxiety] without agoraphobia: ICD-10-CM

## 2017-07-07 DIAGNOSIS — G4733 Obstructive sleep apnea (adult) (pediatric): ICD-10-CM

## 2017-07-07 DIAGNOSIS — R51 Headache: ICD-10-CM

## 2017-07-07 MED ORDER — GLIMEPIRIDE 4 MG PO TAB
8 mg | ORAL_TABLET | Freq: Every day | ORAL | 1 refills | Status: AC
Start: 2017-07-07 — End: 2017-11-26

## 2017-07-07 MED ORDER — OXYCODONE 5 MG PO TAB
5 mg | ORAL_TABLET | ORAL | 0 refills | 6.00000 days | Status: AC | PRN
Start: 2017-07-07 — End: 2017-09-13
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
## 2017-07-07 NOTE — Progress Notes
Jessica Colon is a 50 y.o.  female seen for an integrative behavioral health visit.    Visit Performed:Face to Face    Referring Provider: [facility], Miles Costain, MD    [facility] Provider name: Lonia Farber, LSCSW    Focus of Visit: Social/ Interpersonal Concerns Food Insecurity     Additional Comments: Pt states that she is getting only $16/mo in food stamps and it is not enough to cover the cost of food for the month. Pt states awareness of [employer] and [employer] for food pantries. LSCSW and pt discussed calling [employer] and/or [employer] as well, in order to get additional food resources. LSCSW additionally provided pt with information for how to download for free the [employer] cookbook for cooking on a food stamps budget. LSCSW and pt also discussed contacting the [employer] on Aging to see if pt would qualify for Meals on Wheels.    Specific Intervention:Resource Referral      Plan/Recommendation: Resource Referral food pantries, [employer] [employer]    Patient goals: 1. Use food pantries as needed to supplement my food stamps and income.            2. Call [employer] or [employer] if I need additional options.            3. Try Good and Cheap cookbook to see if there are ways that I can stretch my resources.            4. Call [facility] [employer] to see if I qualify for Meals on Wheels.      Length of visit: 10 minutes

## 2017-07-12 ENCOUNTER — Encounter: Admit: 2017-07-12 | Discharge: 2017-07-12

## 2017-07-12 DIAGNOSIS — M5440 Lumbago with sciatica, unspecified side: Principal | ICD-10-CM

## 2017-07-12 DIAGNOSIS — M546 Pain in thoracic spine: ICD-10-CM

## 2017-07-12 NOTE — Telephone Encounter
RN received vm from patient request callback to discuss some new symptoms that she says "scared her".  RN returned call, Jessica Colon says Sat. AM she woke up & had bilateral arm numbness, wasn't able to grip anything.  She said along with the numbness she had tingling in her left shoulder going across her neck & upper back to the right side.  She has also been experiencing a lot of neck pain as well as lower back pain that she describes as "like a hot poker in my back."      Jessica Colon said Dr. Candiss Norse has done some imaging but feels she has not ever had "my entire spine scanned".  RN reviewed chart & discussed with patient that on 05/27/17 Dr. Merita Norton ordered C-Spine CT but there does not seem to be any recent imaging in her chart for T-Spine or L-Spine.  RN explained that patient would need to complete same PT requirements for imaging as is required by her insurance for an injection.  Patient said she has gone to PT a few times, had to cancel both appointments last week because she was in so much pain but is scheduled to return to PT `10/16 and is planning to go to this appointment.    Of note, patient was unable to tolerate Gabapentin, had intense itching only relieved by Benadryl.  Patient is certain this is caused by Gabapentin since she has been on her other meds for quite some time.  RN will discuss POC with Dr. Tamala Julian & f/u with patient by phone, Jessica Colon vu and agrees to this plan.

## 2017-07-13 ENCOUNTER — Ambulatory Visit: Admit: 2017-07-13 | Discharge: 2017-07-13 | Payer: MEDICAID

## 2017-07-15 ENCOUNTER — Ambulatory Visit: Admit: 2017-07-15 | Discharge: 2017-07-15 | Payer: MEDICAID

## 2017-07-15 ENCOUNTER — Encounter: Admit: 2017-07-15 | Discharge: 2017-07-15

## 2017-07-15 DIAGNOSIS — M5440 Lumbago with sciatica, unspecified side: Principal | ICD-10-CM

## 2017-07-15 DIAGNOSIS — M546 Pain in thoracic spine: ICD-10-CM

## 2017-07-15 NOTE — Telephone Encounter
RN received vm from patient request callback to discuss some new symptoms that she says "scared her".  RN returned call, Jessica Colon says Sat. AM she woke up & had bilateral arm numbness, wasn't able to grip anything.  She said along with the numbness she had tingling in her left shoulder going across her neck & upper back to the right side.  She has also been experiencing a lot of neck pain as well as lower back pain that she describes as "like a hot poker in my back."      Jessica Colon said Dr. Candiss Norse has done some imaging but feels she has not ever had "my entire spine scanned".  RN reviewed chart & discussed with patient that on 05/27/17 Dr. Merita Norton ordered C-Spine CT but there does not seem to be any recent imaging in her chart for T-Spine or L-Spine.  RN explained that patient would need to complete same PT requirements for imaging as is required by her insurance for an injection.  Patient said she has gone to PT a few times, had to cancel both appointments last week because she was in so much pain but is scheduled to return to PT `10/16 and is planning to go to this appointment.    Of note, patient was unable to tolerate Gabapentin, had intense itching only relieved by Benadryl.  Patient is certain this is caused by Gabapentin since she has been on her other meds for quite some time.  RN will discuss POC with Dr. Tamala Julian & f/u with patient by phone, Jessica Colon vu and agrees to this plan.

## 2017-07-15 NOTE — Telephone Encounter
Returned call to pt at this time. Pt reports she is having neck and shoulder pain with severe headache yesterday night, she took her oxycodone and rating her current headaches pain 1 out of 10 at this time. Pt advised to report to ER if she started to experiencing another episode of severe constant headaches without any pain relief. Pt v/u and pt also advised to return to clinic for further evaluation with Dr. Candiss Norse for proper treatment. Pt agreed and stated she will continue monitor and will repot to ER if her symptoms got worsen.Pt v/u and appreciation.

## 2017-07-16 ENCOUNTER — Ambulatory Visit: Admit: 2017-07-15 | Discharge: 2017-07-16 | Payer: MEDICAID

## 2017-07-16 DIAGNOSIS — M5440 Lumbago with sciatica, unspecified side: Principal | ICD-10-CM

## 2017-07-16 DIAGNOSIS — M546 Pain in thoracic spine: Secondary | ICD-10-CM

## 2017-07-16 DIAGNOSIS — G8929 Other chronic pain: ICD-10-CM

## 2017-07-21 ENCOUNTER — Encounter: Admit: 2017-07-21 | Discharge: 2017-07-21
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
## 2017-07-21 NOTE — Telephone Encounter
RN returned call to let patient know Dr. Tamala Julian reviewed her T-Spine & L-Spine XR from last Friday - explained that Dr. Tamala Julian said her XR showed evidence of her prior surgery, moderate-severe arthritic changes, no fractures and some slippage in her L-Spine.  Patient was surprised this could be causing this much pain in her back - RN explained that arthritic changes can most certainly cause a lot of pain, that just because there is no new diagnosis doesn't mean she isn't in pain.  Patient v/u after this discussion and expressed satisfaction with this call.

## 2017-07-22 NOTE — Telephone Encounter
Pt notified of Dr. Keturah Barre recommendations and pt v/u.

## 2017-07-29 ENCOUNTER — Encounter: Admit: 2017-07-29 | Discharge: 2017-07-29

## 2017-07-29 ENCOUNTER — Ambulatory Visit: Admit: 2017-07-29 | Discharge: 2017-07-29 | Payer: MEDICAID

## 2017-07-29 DIAGNOSIS — K219 Gastro-esophageal reflux disease without esophagitis: ICD-10-CM

## 2017-07-29 DIAGNOSIS — F411 Generalized anxiety disorder: ICD-10-CM

## 2017-07-29 DIAGNOSIS — C539 Malignant neoplasm of cervix uteri, unspecified: ICD-10-CM

## 2017-07-29 DIAGNOSIS — M1990 Unspecified osteoarthritis, unspecified site: ICD-10-CM

## 2017-07-29 DIAGNOSIS — F429 Obsessive-compulsive disorder, unspecified: ICD-10-CM

## 2017-07-29 DIAGNOSIS — R51 Headache: ICD-10-CM

## 2017-07-29 DIAGNOSIS — G4733 Obstructive sleep apnea (adult) (pediatric): ICD-10-CM

## 2017-07-29 DIAGNOSIS — F419 Anxiety disorder, unspecified: ICD-10-CM

## 2017-07-29 DIAGNOSIS — A64 Unspecified sexually transmitted disease: ICD-10-CM

## 2017-07-29 DIAGNOSIS — E119 Type 2 diabetes mellitus without complications: ICD-10-CM

## 2017-07-29 DIAGNOSIS — E785 Hyperlipidemia, unspecified: ICD-10-CM

## 2017-07-29 DIAGNOSIS — R52 Pain, unspecified: ICD-10-CM

## 2017-07-29 DIAGNOSIS — F329 Major depressive disorder, single episode, unspecified: ICD-10-CM

## 2017-07-29 DIAGNOSIS — I10 Essential (primary) hypertension: Principal | ICD-10-CM

## 2017-07-29 DIAGNOSIS — G4700 Insomnia, unspecified: ICD-10-CM

## 2017-07-29 DIAGNOSIS — F332 Major depressive disorder, recurrent severe without psychotic features: Principal | ICD-10-CM

## 2017-07-29 DIAGNOSIS — M25559 Pain in unspecified hip: ICD-10-CM

## 2017-07-29 DIAGNOSIS — M5030 Other cervical disc degeneration, unspecified cervical region: ICD-10-CM

## 2017-07-29 DIAGNOSIS — F410 Panic disorder [episodic paroxysmal anxiety] without agoraphobia: ICD-10-CM

## 2017-07-29 DIAGNOSIS — T148XXA Other injury of unspecified body region, initial encounter: ICD-10-CM

## 2017-07-29 MED ORDER — CLONAZEPAM 1 MG PO TAB
1 mg | ORAL_TABLET | Freq: Every evening | ORAL | 0 refills | Status: AC | PRN
Start: 2017-07-29 — End: 2017-10-16

## 2017-07-29 MED ORDER — CLONAZEPAM 1 MG PO TAB
ORAL_TABLET | Freq: Every evening | 0 refills | Status: AC
Start: 2017-07-29 — End: 2017-11-01

## 2017-07-29 MED ORDER — MELATONIN 3 MG PO TAB
3 mg | ORAL_TABLET | Freq: Every evening | ORAL | 0 refills | 28.00000 days | Status: AC
Start: 2017-07-29 — End: 2017-11-09

## 2017-07-29 MED ORDER — BUSPIRONE 10 MG PO TAB
20 mg | ORAL_TABLET | Freq: Two times a day (BID) | ORAL | 2 refills | Status: AC
Start: 2017-07-29 — End: 2017-11-09

## 2017-07-29 MED ORDER — DULOXETINE 60 MG PO CPDR
120 mg | ORAL_CAPSULE | Freq: Every day | ORAL | 2 refills | 60.00000 days | Status: AC
Start: 2017-07-29 — End: 2017-11-22

## 2017-07-29 NOTE — Progress Notes
I saw & examined the patient at time of visit. Reviewed the notes and discussed with resident. I personally performed the key portions of the E/M visit, have discussed the patient with and concur with resident documentation of history, mental status exam, assessment, and treatment plan.

## 2017-08-17 ENCOUNTER — Encounter: Admit: 2017-08-17 | Discharge: 2017-08-17

## 2017-08-17 NOTE — Progress Notes
Orly Quimby Tulani Kidney is a 51 y.o.  female seen for a follow up behavioral health visit.    Visit Performed:Telephone    BH Provider name: Lonia Farber, LSCSW    Focus of Visit: Social/ Interpersonal Concerns Food Insecurity     Session Content: Pt states that she called [employer] on Aging, but learned that she is ineligible for Meals on Wheels. Pt states that she continues to go to [employer] for their food pantry, and can go to food pantry in [place] to pick up bread if needed.    Specific Intervention:Resource Referral      Plan/Recommendation: No follow-up Integrative Vists Needed    Patient goals/Progress toward: 1. Use food pantries as needed to supplement my food stamps and income.--MET/ONGOING                        2. Call [employer] or [employer] if I need additional options.--MET/ONGOING                        3. Try Good and Cheap cookbook to see if there are ways that I can stretch my resources.--NOT MET/ONGOING                        4. Call [facility] [employer] to see if I qualify for Meals on Wheels.--MET      Length of visit: 13 minutes

## 2017-08-31 ENCOUNTER — Encounter: Admit: 2017-08-31 | Discharge: 2017-08-31

## 2017-08-31 DIAGNOSIS — M542 Cervicalgia: Principal | ICD-10-CM

## 2017-09-04 ENCOUNTER — Encounter: Admit: 2017-09-04 | Discharge: 2017-09-04

## 2017-09-04 NOTE — Telephone Encounter
Call placed to patient 10:36 on 09/04/17. Pt awoke from sleep around 01:00 as she felt her heart beating "hard." She checked her BP and reports it was elevated to 137/84. She reports this is higher than normal. She has been taking lisinopril/HCTZ. Reports her BP is normally below 120/70's. Reports the last few nights she feels her BP has been elevated. She also reports a HA that goes away on its own in an hour or less. She reports pain in her right and left arms, but states she has a bad back with some nerve pain. Reports she had her heart checked out a month or so ago at [place] where she was told her heart was "perfect."    Pt reports blood sugars have been elevated. Reports fasting sugar of 331 this AM. She is taking 8 mg amaryl QAM and no evening dose. Reports her PCP changed Amaryl dose from 4 mg bid to 8 mg QAM. Pt is concerned this dose change is not helping to control sugars. She reports currently feeling shaky and worn out. Denies nausea. She does have some numbness in her left fingers. She is having sweats at night. Reports she is chronically thirsty. She reports urinary urgency but denies frequency - reports urinating 6-10x/day.     Plan:  Recommended patient visit an Urgent Care to be looked at for her elevated fasting sugars. Counseled pt that her BP is not concerning as it is still at an appropriate value. Recommended pt f/u with PCP for DM medication management.

## 2017-09-13 ENCOUNTER — Encounter: Admit: 2017-09-13 | Discharge: 2017-09-13

## 2017-09-13 DIAGNOSIS — G8929 Other chronic pain: Principal | ICD-10-CM

## 2017-09-13 MED ORDER — OXYCODONE 5 MG PO TAB
5 mg | ORAL_TABLET | ORAL | 0 refills | 6.00000 days | Status: AC | PRN
Start: 2017-09-13 — End: 2017-09-16

## 2017-09-13 NOTE — Telephone Encounter
Patient calls and states that she is needing a refill of oxycodone.  Appointment 10/06/16

## 2017-09-13 NOTE — Telephone Encounter
Patient advised script ready for pick up.

## 2017-09-16 ENCOUNTER — Encounter: Admit: 2017-09-16 | Discharge: 2017-09-16

## 2017-09-16 DIAGNOSIS — G8929 Other chronic pain: Principal | ICD-10-CM

## 2017-09-16 MED ORDER — OXYCODONE 5 MG PO TAB
5 mg | ORAL_TABLET | ORAL | 0 refills | 6.00000 days | Status: AC | PRN
Start: 2017-09-16 — End: 2017-10-14

## 2017-09-16 NOTE — Telephone Encounter
Script previously approved by Dr. Candiss Norse. Unable to find in clinic.

## 2017-10-01 ENCOUNTER — Ambulatory Visit: Admit: 2017-10-01 | Discharge: 2017-10-02 | Payer: Medicaid Other

## 2017-10-01 ENCOUNTER — Encounter: Admit: 2017-10-01 | Discharge: 2017-10-01

## 2017-10-01 DIAGNOSIS — I10 Essential (primary) hypertension: Principal | ICD-10-CM

## 2017-10-01 DIAGNOSIS — A64 Unspecified sexually transmitted disease: ICD-10-CM

## 2017-10-01 DIAGNOSIS — E119 Type 2 diabetes mellitus without complications: ICD-10-CM

## 2017-10-01 DIAGNOSIS — E785 Hyperlipidemia, unspecified: ICD-10-CM

## 2017-10-01 DIAGNOSIS — Z114 Encounter for screening for human immunodeficiency virus [HIV]: ICD-10-CM

## 2017-10-01 DIAGNOSIS — R51 Headache: ICD-10-CM

## 2017-10-01 DIAGNOSIS — F429 Obsessive-compulsive disorder, unspecified: ICD-10-CM

## 2017-10-01 DIAGNOSIS — G4733 Obstructive sleep apnea (adult) (pediatric): ICD-10-CM

## 2017-10-01 DIAGNOSIS — C539 Malignant neoplasm of cervix uteri, unspecified: ICD-10-CM

## 2017-10-01 DIAGNOSIS — Z0189 Encounter for other specified special examinations: Principal | ICD-10-CM

## 2017-10-01 DIAGNOSIS — F419 Anxiety disorder, unspecified: ICD-10-CM

## 2017-10-01 DIAGNOSIS — F410 Panic disorder [episodic paroxysmal anxiety] without agoraphobia: ICD-10-CM

## 2017-10-01 DIAGNOSIS — R52 Pain, unspecified: ICD-10-CM

## 2017-10-01 DIAGNOSIS — M5030 Other cervical disc degeneration, unspecified cervical region: ICD-10-CM

## 2017-10-01 DIAGNOSIS — K219 Gastro-esophageal reflux disease without esophagitis: ICD-10-CM

## 2017-10-01 DIAGNOSIS — F329 Major depressive disorder, single episode, unspecified: ICD-10-CM

## 2017-10-01 DIAGNOSIS — T148XXA Other injury of unspecified body region, initial encounter: ICD-10-CM

## 2017-10-01 DIAGNOSIS — M1990 Unspecified osteoarthritis, unspecified site: ICD-10-CM

## 2017-10-01 DIAGNOSIS — M25559 Pain in unspecified hip: ICD-10-CM

## 2017-10-01 MED ORDER — LISINOPRIL 20 MG PO TAB
20 mg | ORAL_TABLET | Freq: Every day | ORAL | 1 refills | Status: AC
Start: 2017-10-01 — End: 2018-03-25

## 2017-10-12 ENCOUNTER — Encounter: Admit: 2017-10-12 | Discharge: 2017-10-12

## 2017-10-12 MED ORDER — TIZANIDINE 4 MG PO CAP
4 mg | ORAL_CAPSULE | Freq: Three times a day (TID) | ORAL | 0 refills | Status: AC | PRN
Start: 2017-10-12 — End: 2018-08-01

## 2017-10-13 ENCOUNTER — Encounter: Admit: 2017-10-13 | Discharge: 2017-10-13

## 2017-10-13 DIAGNOSIS — G8929 Other chronic pain: Principal | ICD-10-CM

## 2017-10-14 ENCOUNTER — Encounter: Admit: 2017-10-14 | Discharge: 2017-10-14

## 2017-10-14 DIAGNOSIS — F411 Generalized anxiety disorder: Principal | ICD-10-CM

## 2017-10-14 MED ORDER — OXYCODONE 5 MG PO TAB
5 mg | ORAL_TABLET | ORAL | 0 refills | 6.00000 days | Status: AC | PRN
Start: 2017-10-14 — End: 2018-12-22

## 2017-10-16 ENCOUNTER — Encounter: Admit: 2017-10-16 | Discharge: 2017-10-16

## 2017-10-16 MED ORDER — CLONAZEPAM 1 MG PO TAB
1 mg | ORAL_TABLET | Freq: Every evening | ORAL | 0 refills | Status: AC | PRN
Start: 2017-10-16 — End: 2017-11-09

## 2017-10-18 ENCOUNTER — Encounter: Admit: 2017-10-18 | Discharge: 2017-10-18

## 2017-10-19 ENCOUNTER — Encounter: Admit: 2017-10-19 | Discharge: 2017-10-19

## 2017-10-21 ENCOUNTER — Encounter: Admit: 2017-10-21 | Discharge: 2017-10-21

## 2017-10-21 MED ORDER — PANTOPRAZOLE 40 MG PO TBEC
40 mg | ORAL_TABLET | Freq: Every day | ORAL | 0 refills | 90.00000 days | Status: AC
Start: 2017-10-21 — End: 2017-12-14

## 2017-10-28 ENCOUNTER — Encounter: Admit: 2017-10-28 | Discharge: 2017-10-28

## 2017-10-29 ENCOUNTER — Encounter: Admit: 2017-10-29 | Discharge: 2017-10-29

## 2017-10-29 DIAGNOSIS — F411 Generalized anxiety disorder: Principal | ICD-10-CM

## 2017-10-31 ENCOUNTER — Encounter: Admit: 2017-10-31 | Discharge: 2017-10-31

## 2017-11-01 MED ORDER — CLONAZEPAM 1 MG PO TAB
ORAL_TABLET | Freq: Every evening | 0 refills | Status: AC
Start: 2017-11-01 — End: 2017-11-09

## 2017-11-09 ENCOUNTER — Encounter: Admit: 2017-11-09 | Discharge: 2017-11-09

## 2017-11-09 ENCOUNTER — Ambulatory Visit: Admit: 2017-11-09 | Discharge: 2017-11-09 | Payer: Medicaid Other

## 2017-11-09 DIAGNOSIS — F419 Anxiety disorder, unspecified: ICD-10-CM

## 2017-11-09 DIAGNOSIS — M5030 Other cervical disc degeneration, unspecified cervical region: ICD-10-CM

## 2017-11-09 DIAGNOSIS — C539 Malignant neoplasm of cervix uteri, unspecified: ICD-10-CM

## 2017-11-09 DIAGNOSIS — F329 Major depressive disorder, single episode, unspecified: ICD-10-CM

## 2017-11-09 DIAGNOSIS — F429 Obsessive-compulsive disorder, unspecified: ICD-10-CM

## 2017-11-09 DIAGNOSIS — K219 Gastro-esophageal reflux disease without esophagitis: ICD-10-CM

## 2017-11-09 DIAGNOSIS — T148XXA Other injury of unspecified body region, initial encounter: ICD-10-CM

## 2017-11-09 DIAGNOSIS — R52 Pain, unspecified: ICD-10-CM

## 2017-11-09 DIAGNOSIS — A64 Unspecified sexually transmitted disease: ICD-10-CM

## 2017-11-09 DIAGNOSIS — F411 Generalized anxiety disorder: Principal | ICD-10-CM

## 2017-11-09 DIAGNOSIS — M1990 Unspecified osteoarthritis, unspecified site: ICD-10-CM

## 2017-11-09 DIAGNOSIS — M25559 Pain in unspecified hip: ICD-10-CM

## 2017-11-09 DIAGNOSIS — E119 Type 2 diabetes mellitus without complications: ICD-10-CM

## 2017-11-09 DIAGNOSIS — F330 Major depressive disorder, recurrent, mild: ICD-10-CM

## 2017-11-09 DIAGNOSIS — F410 Panic disorder [episodic paroxysmal anxiety] without agoraphobia: ICD-10-CM

## 2017-11-09 DIAGNOSIS — R51 Headache: ICD-10-CM

## 2017-11-09 DIAGNOSIS — G4733 Obstructive sleep apnea (adult) (pediatric): ICD-10-CM

## 2017-11-09 DIAGNOSIS — E785 Hyperlipidemia, unspecified: ICD-10-CM

## 2017-11-09 DIAGNOSIS — F1320 Sedative, hypnotic or anxiolytic dependence, uncomplicated: ICD-10-CM

## 2017-11-09 DIAGNOSIS — I10 Essential (primary) hypertension: Principal | ICD-10-CM

## 2017-11-09 MED ORDER — CLONAZEPAM 0.5 MG PO TAB
.5 mg | ORAL_TABLET | Freq: Every evening | ORAL | 0 refills | Status: AC | PRN
Start: 2017-11-09 — End: 2017-12-09

## 2017-11-09 MED ORDER — CLONAZEPAM 0.5 MG PO TAB
.25 mg | ORAL_TABLET | Freq: Every evening | ORAL | 0 refills | Status: AC
Start: 2017-11-09 — End: 2017-12-29

## 2017-11-09 MED ORDER — MIRTAZAPINE 7.5 MG PO TAB
7.5 mg | ORAL_TABLET | Freq: Every evening | ORAL | 2 refills | Status: AC
Start: 2017-11-09 — End: 2017-12-09

## 2017-11-10 ENCOUNTER — Encounter: Admit: 2017-11-10 | Discharge: 2017-11-10

## 2017-11-19 ENCOUNTER — Encounter: Admit: 2017-11-19 | Discharge: 2017-11-19

## 2017-11-19 DIAGNOSIS — F411 Generalized anxiety disorder: Principal | ICD-10-CM

## 2017-11-21 MED ORDER — DULOXETINE 60 MG PO CPDR
120 mg | ORAL_CAPSULE | Freq: Every day | ORAL | 2 refills | 60.00000 days | Status: AC
Start: 2017-11-21 — End: 2018-02-25

## 2017-11-26 ENCOUNTER — Encounter: Admit: 2017-11-26 | Discharge: 2017-11-26

## 2017-11-26 DIAGNOSIS — E119 Type 2 diabetes mellitus without complications: Principal | ICD-10-CM

## 2017-11-26 MED ORDER — GLIMEPIRIDE 4 MG PO TAB
ORAL_TABLET | Freq: Every day | 0 refills | Status: AC
Start: 2017-11-26 — End: 2018-01-03

## 2017-11-30 ENCOUNTER — Encounter: Admit: 2017-11-30 | Discharge: 2017-11-30

## 2017-12-02 ENCOUNTER — Encounter: Admit: 2017-12-02 | Discharge: 2017-12-02

## 2017-12-07 ENCOUNTER — Encounter: Admit: 2017-12-07 | Discharge: 2017-12-07

## 2017-12-09 ENCOUNTER — Ambulatory Visit: Admit: 2017-12-09 | Discharge: 2017-12-10 | Payer: Medicaid Other

## 2017-12-09 ENCOUNTER — Encounter: Admit: 2017-12-09 | Discharge: 2017-12-09

## 2017-12-09 ENCOUNTER — Ambulatory Visit: Admit: 2017-12-09 | Discharge: 2017-12-09 | Payer: Medicaid Other

## 2017-12-09 DIAGNOSIS — I10 Essential (primary) hypertension: ICD-10-CM

## 2017-12-09 DIAGNOSIS — E119 Type 2 diabetes mellitus without complications: ICD-10-CM

## 2017-12-09 DIAGNOSIS — F411 Generalized anxiety disorder: ICD-10-CM

## 2017-12-09 DIAGNOSIS — E6609 Other obesity due to excess calories: Secondary | ICD-10-CM

## 2017-12-09 DIAGNOSIS — F410 Panic disorder [episodic paroxysmal anxiety] without agoraphobia: ICD-10-CM

## 2017-12-09 DIAGNOSIS — F329 Major depressive disorder, single episode, unspecified: ICD-10-CM

## 2017-12-09 DIAGNOSIS — M4802 Spinal stenosis, cervical region: ICD-10-CM

## 2017-12-09 DIAGNOSIS — A64 Unspecified sexually transmitted disease: ICD-10-CM

## 2017-12-09 DIAGNOSIS — K219 Gastro-esophageal reflux disease without esophagitis: ICD-10-CM

## 2017-12-09 DIAGNOSIS — Z6834 Body mass index (BMI) 34.0-34.9, adult: ICD-10-CM

## 2017-12-09 DIAGNOSIS — R52 Pain, unspecified: ICD-10-CM

## 2017-12-09 DIAGNOSIS — M25559 Pain in unspecified hip: ICD-10-CM

## 2017-12-09 DIAGNOSIS — F429 Obsessive-compulsive disorder, unspecified: ICD-10-CM

## 2017-12-09 DIAGNOSIS — T148XXA Other injury of unspecified body region, initial encounter: ICD-10-CM

## 2017-12-09 DIAGNOSIS — G4733 Obstructive sleep apnea (adult) (pediatric): ICD-10-CM

## 2017-12-09 DIAGNOSIS — E785 Hyperlipidemia, unspecified: ICD-10-CM

## 2017-12-09 DIAGNOSIS — M5030 Other cervical disc degeneration, unspecified cervical region: ICD-10-CM

## 2017-12-09 DIAGNOSIS — Z0000 Encounter for general adult medical examination without abnormal findings: ICD-10-CM

## 2017-12-09 DIAGNOSIS — F331 Major depressive disorder, recurrent, moderate: ICD-10-CM

## 2017-12-09 DIAGNOSIS — M1990 Unspecified osteoarthritis, unspecified site: ICD-10-CM

## 2017-12-09 DIAGNOSIS — F419 Anxiety disorder, unspecified: ICD-10-CM

## 2017-12-09 DIAGNOSIS — C539 Malignant neoplasm of cervix uteri, unspecified: ICD-10-CM

## 2017-12-09 DIAGNOSIS — M4712 Other spondylosis with myelopathy, cervical region: ICD-10-CM

## 2017-12-09 DIAGNOSIS — Z114 Encounter for screening for human immunodeficiency virus [HIV]: ICD-10-CM

## 2017-12-09 DIAGNOSIS — R51 Headache: ICD-10-CM

## 2017-12-09 LAB — CBC AND DIFF
Lab: 0 % (ref 0–2)
Lab: 0 10*3/uL (ref 0–0.20)
Lab: 0.1 10*3/uL (ref 0–0.45)
Lab: 0.4 10*3/uL (ref 0–0.80)
Lab: 13 % (ref 11–15)
Lab: 15 g/dL (ref 12.0–15.0)
Lab: 2 % (ref >60)
Lab: 2.9 10*3/uL (ref 1.0–4.8)
Lab: 251 10*3/uL (ref 150–400)
Lab: 30 pg (ref 26–34)
Lab: 33 g/dL (ref 32.0–36.0)
Lab: 36 % (ref 24–44)
Lab: 4.6 10*3/uL (ref 1.8–7.0)
Lab: 4.9 M/UL (ref 4.0–5.0)
Lab: 45 % — ABNORMAL HIGH (ref 36–45)
Lab: 5 % (ref >60)
Lab: 57 % (ref 41–77)
Lab: 8.1 10*3/uL — ABNORMAL HIGH (ref 4.5–11.0)
Lab: 8.7 FL (ref 7–11)
Lab: 91 FL — ABNORMAL HIGH (ref 80–100)

## 2017-12-09 LAB — HEMOGLOBIN A1C: Lab: 10 % — ABNORMAL HIGH (ref 4.0–6.0)

## 2017-12-09 LAB — LIPID PROFILE
Lab: 214 mg/dL — ABNORMAL HIGH (ref <200)
Lab: 220 mg/dL — ABNORMAL HIGH (ref <150)

## 2017-12-09 LAB — COMPREHENSIVE METABOLIC PANEL
Lab: 102 MMOL/L (ref 98–110)
Lab: 134 MMOL/L — ABNORMAL LOW (ref >40)
Lab: 3.8 MMOL/L — ABNORMAL HIGH (ref <100)

## 2017-12-09 LAB — HIV 1& 2 AG-AB SCRN W REFLEX HIV 1 PCR QUANT: Lab: NEGATIVE

## 2017-12-09 LAB — TSH WITH FREE T4 REFLEX: Lab: 1.7 uU/mL (ref 0.35–5.00)

## 2017-12-09 LAB — 25-OH VITAMIN D (D2 + D3): Lab: 18 ng/mL — ABNORMAL LOW (ref 30–80)

## 2017-12-09 MED ORDER — MELOXICAM 7.5 MG PO TAB
7.5 mg | ORAL_TABLET | Freq: Every day | ORAL | 3 refills | 30.00000 days | Status: AC
Start: 2017-12-09 — End: 2017-12-29

## 2017-12-14 ENCOUNTER — Encounter: Admit: 2017-12-14 | Discharge: 2017-12-14

## 2017-12-14 ENCOUNTER — Ambulatory Visit: Admit: 2017-12-14 | Discharge: 2017-12-14 | Payer: Medicaid Other

## 2017-12-14 DIAGNOSIS — E559 Vitamin D deficiency, unspecified: Principal | ICD-10-CM

## 2017-12-14 DIAGNOSIS — F330 Major depressive disorder, recurrent, mild: ICD-10-CM

## 2017-12-14 DIAGNOSIS — F410 Panic disorder [episodic paroxysmal anxiety] without agoraphobia: ICD-10-CM

## 2017-12-14 DIAGNOSIS — F411 Generalized anxiety disorder: Principal | ICD-10-CM

## 2017-12-14 MED ORDER — HYDROXYZINE HCL 25 MG PO TAB
25 mg | ORAL_TABLET | Freq: Three times a day (TID) | ORAL | 1 refills | 30.00000 days | Status: AC | PRN
Start: 2017-12-14 — End: 2017-12-29

## 2017-12-14 MED ORDER — CHOLECALCIFEROL (VITAMIN D3) 50,000 UNIT PO CAP
50000 [IU] | ORAL_CAPSULE | ORAL | 0 refills | 84.00000 days | Status: AC
Start: 2017-12-14 — End: 2020-02-14

## 2017-12-15 ENCOUNTER — Encounter: Admit: 2017-12-15 | Discharge: 2017-12-15

## 2017-12-15 DIAGNOSIS — M25559 Pain in unspecified hip: ICD-10-CM

## 2017-12-15 DIAGNOSIS — E785 Hyperlipidemia, unspecified: ICD-10-CM

## 2017-12-15 DIAGNOSIS — G4733 Obstructive sleep apnea (adult) (pediatric): ICD-10-CM

## 2017-12-15 DIAGNOSIS — C539 Malignant neoplasm of cervix uteri, unspecified: ICD-10-CM

## 2017-12-15 DIAGNOSIS — A64 Unspecified sexually transmitted disease: ICD-10-CM

## 2017-12-15 DIAGNOSIS — M5030 Other cervical disc degeneration, unspecified cervical region: ICD-10-CM

## 2017-12-15 DIAGNOSIS — R51 Headache: ICD-10-CM

## 2017-12-15 DIAGNOSIS — T148XXA Other injury of unspecified body region, initial encounter: ICD-10-CM

## 2017-12-15 DIAGNOSIS — F329 Major depressive disorder, single episode, unspecified: ICD-10-CM

## 2017-12-15 DIAGNOSIS — E119 Type 2 diabetes mellitus without complications: ICD-10-CM

## 2017-12-15 DIAGNOSIS — F410 Panic disorder [episodic paroxysmal anxiety] without agoraphobia: ICD-10-CM

## 2017-12-15 DIAGNOSIS — F419 Anxiety disorder, unspecified: ICD-10-CM

## 2017-12-15 DIAGNOSIS — R52 Pain, unspecified: ICD-10-CM

## 2017-12-15 DIAGNOSIS — F429 Obsessive-compulsive disorder, unspecified: ICD-10-CM

## 2017-12-15 DIAGNOSIS — I10 Essential (primary) hypertension: Principal | ICD-10-CM

## 2017-12-15 DIAGNOSIS — K219 Gastro-esophageal reflux disease without esophagitis: ICD-10-CM

## 2017-12-15 DIAGNOSIS — M1990 Unspecified osteoarthritis, unspecified site: ICD-10-CM

## 2017-12-28 ENCOUNTER — Encounter: Admit: 2017-12-28 | Discharge: 2017-12-28

## 2017-12-29 MED ORDER — HYDROXYZINE HCL 50 MG PO TAB
50 mg | ORAL_TABLET | Freq: Four times a day (QID) | ORAL | 1 refills | 30.00000 days | Status: AC | PRN
Start: 2017-12-29 — End: 2018-02-25

## 2017-12-30 ENCOUNTER — Encounter: Admit: 2017-12-30 | Discharge: 2017-12-30

## 2017-12-30 DIAGNOSIS — M25559 Pain in unspecified hip: ICD-10-CM

## 2017-12-30 DIAGNOSIS — K219 Gastro-esophageal reflux disease without esophagitis: ICD-10-CM

## 2017-12-30 DIAGNOSIS — M1990 Unspecified osteoarthritis, unspecified site: ICD-10-CM

## 2017-12-30 DIAGNOSIS — R51 Headache: ICD-10-CM

## 2017-12-30 DIAGNOSIS — F410 Panic disorder [episodic paroxysmal anxiety] without agoraphobia: ICD-10-CM

## 2017-12-30 DIAGNOSIS — I10 Essential (primary) hypertension: Principal | ICD-10-CM

## 2017-12-30 DIAGNOSIS — E785 Hyperlipidemia, unspecified: ICD-10-CM

## 2017-12-30 DIAGNOSIS — E119 Type 2 diabetes mellitus without complications: ICD-10-CM

## 2017-12-30 DIAGNOSIS — F419 Anxiety disorder, unspecified: ICD-10-CM

## 2017-12-30 DIAGNOSIS — T148XXA Other injury of unspecified body region, initial encounter: ICD-10-CM

## 2017-12-30 DIAGNOSIS — F429 Obsessive-compulsive disorder, unspecified: ICD-10-CM

## 2017-12-30 DIAGNOSIS — G4733 Obstructive sleep apnea (adult) (pediatric): ICD-10-CM

## 2017-12-30 DIAGNOSIS — C539 Malignant neoplasm of cervix uteri, unspecified: ICD-10-CM

## 2017-12-30 DIAGNOSIS — A64 Unspecified sexually transmitted disease: ICD-10-CM

## 2017-12-30 DIAGNOSIS — R52 Pain, unspecified: ICD-10-CM

## 2017-12-30 DIAGNOSIS — M5030 Other cervical disc degeneration, unspecified cervical region: ICD-10-CM

## 2017-12-30 DIAGNOSIS — F329 Major depressive disorder, single episode, unspecified: ICD-10-CM

## 2018-01-03 ENCOUNTER — Encounter: Admit: 2018-01-03 | Discharge: 2018-01-03

## 2018-01-03 DIAGNOSIS — E119 Type 2 diabetes mellitus without complications: Principal | ICD-10-CM

## 2018-01-03 MED ORDER — GLIMEPIRIDE 4 MG PO TAB
ORAL_TABLET | Freq: Every day | 0 refills | Status: AC
Start: 2018-01-03 — End: 2018-02-11

## 2018-01-04 ENCOUNTER — Encounter: Admit: 2018-01-04 | Discharge: 2018-01-04

## 2018-01-04 DIAGNOSIS — T148XXA Other injury of unspecified body region, initial encounter: ICD-10-CM

## 2018-01-04 DIAGNOSIS — M5030 Other cervical disc degeneration, unspecified cervical region: ICD-10-CM

## 2018-01-04 DIAGNOSIS — G4733 Obstructive sleep apnea (adult) (pediatric): ICD-10-CM

## 2018-01-04 DIAGNOSIS — F419 Anxiety disorder, unspecified: ICD-10-CM

## 2018-01-04 DIAGNOSIS — E119 Type 2 diabetes mellitus without complications: ICD-10-CM

## 2018-01-04 DIAGNOSIS — F429 Obsessive-compulsive disorder, unspecified: ICD-10-CM

## 2018-01-04 DIAGNOSIS — F329 Major depressive disorder, single episode, unspecified: ICD-10-CM

## 2018-01-04 DIAGNOSIS — M1990 Unspecified osteoarthritis, unspecified site: ICD-10-CM

## 2018-01-04 DIAGNOSIS — A64 Unspecified sexually transmitted disease: ICD-10-CM

## 2018-01-04 DIAGNOSIS — R52 Pain, unspecified: ICD-10-CM

## 2018-01-04 DIAGNOSIS — M25559 Pain in unspecified hip: ICD-10-CM

## 2018-01-04 DIAGNOSIS — F410 Panic disorder [episodic paroxysmal anxiety] without agoraphobia: ICD-10-CM

## 2018-01-04 DIAGNOSIS — K219 Gastro-esophageal reflux disease without esophagitis: ICD-10-CM

## 2018-01-04 DIAGNOSIS — E785 Hyperlipidemia, unspecified: ICD-10-CM

## 2018-01-04 DIAGNOSIS — C539 Malignant neoplasm of cervix uteri, unspecified: ICD-10-CM

## 2018-01-04 DIAGNOSIS — M4802 Spinal stenosis, cervical region: Secondary | ICD-10-CM

## 2018-01-04 DIAGNOSIS — I10 Essential (primary) hypertension: Principal | ICD-10-CM

## 2018-01-04 DIAGNOSIS — R51 Headache: ICD-10-CM

## 2018-01-04 MED ORDER — ALPHA LIPOIC ACID 600 MG PO CAP
600 mg | ORAL_CAPSULE | Freq: Every day | ORAL | 5 refills | 28.00000 days | Status: AC
Start: 2018-01-04 — End: 2018-08-01

## 2018-01-05 ENCOUNTER — Ambulatory Visit: Admit: 2018-01-04 | Discharge: 2018-01-05 | Payer: Medicaid Other

## 2018-01-05 DIAGNOSIS — M5412 Radiculopathy, cervical region: Principal | ICD-10-CM

## 2018-01-05 DIAGNOSIS — R200 Anesthesia of skin: ICD-10-CM

## 2018-01-05 DIAGNOSIS — Z981 Arthrodesis status: ICD-10-CM

## 2018-01-05 DIAGNOSIS — M4712 Other spondylosis with myelopathy, cervical region: ICD-10-CM

## 2018-01-05 DIAGNOSIS — R202 Paresthesia of skin: Secondary | ICD-10-CM

## 2018-02-11 ENCOUNTER — Encounter: Admit: 2018-02-11 | Discharge: 2018-02-11

## 2018-02-11 DIAGNOSIS — E119 Type 2 diabetes mellitus without complications: Principal | ICD-10-CM

## 2018-02-11 MED ORDER — GLIMEPIRIDE 4 MG PO TAB
4 mg | ORAL_TABLET | Freq: Every morning | ORAL | 0 refills | Status: AC
Start: 2018-02-11 — End: 2018-08-01

## 2018-02-15 ENCOUNTER — Encounter: Admit: 2018-02-15 | Discharge: 2018-02-15

## 2018-02-17 ENCOUNTER — Encounter: Admit: 2018-02-17 | Discharge: 2018-02-17

## 2018-02-23 ENCOUNTER — Encounter: Admit: 2018-02-23 | Discharge: 2018-02-23

## 2018-02-24 ENCOUNTER — Encounter: Admit: 2018-02-24 | Discharge: 2018-02-24

## 2018-02-24 DIAGNOSIS — F411 Generalized anxiety disorder: Principal | ICD-10-CM

## 2018-02-24 MED ORDER — DULOXETINE 60 MG PO CPDR
120 mg | ORAL_CAPSULE | Freq: Every day | ORAL | 2 refills | 60.00000 days | Status: AC
Start: 2018-02-24 — End: 2018-05-03

## 2018-02-24 MED ORDER — HYDROXYZINE HCL 50 MG PO TAB
ORAL_TABLET | Freq: Four times a day (QID) | ORAL | 1 refills | 30.00000 days | Status: AC | PRN
Start: 2018-02-24 — End: 2018-05-03

## 2018-03-25 ENCOUNTER — Encounter: Admit: 2018-03-25 | Discharge: 2018-03-25

## 2018-03-25 DIAGNOSIS — I10 Essential (primary) hypertension: Principal | ICD-10-CM

## 2018-03-25 MED ORDER — LISINOPRIL 20 MG PO TAB
ORAL_TABLET | Freq: Every day | 0 refills | Status: AC
Start: 2018-03-25 — End: 2018-07-29

## 2018-04-21 ENCOUNTER — Encounter: Admit: 2018-04-21 | Discharge: 2018-04-21

## 2018-04-21 DIAGNOSIS — R21 Rash and other nonspecific skin eruption: Principal | ICD-10-CM

## 2018-04-22 MED ORDER — CLOBETASOL 0.05 % TP CREA
Freq: Two times a day (BID) | OPHTHALMIC | 1 refills | 30.00000 days | Status: AC
Start: 2018-04-22 — End: 2019-11-06

## 2018-05-03 ENCOUNTER — Encounter: Admit: 2018-05-03 | Discharge: 2018-05-03

## 2018-05-03 DIAGNOSIS — F411 Generalized anxiety disorder: Principal | ICD-10-CM

## 2018-05-03 MED ORDER — HYDROXYZINE HCL 50 MG PO TAB
50 mg | ORAL_TABLET | Freq: Four times a day (QID) | ORAL | 1 refills | 30.00000 days | Status: AC | PRN
Start: 2018-05-03 — End: 2018-07-05

## 2018-05-03 MED ORDER — DULOXETINE 60 MG PO CPDR
120 mg | ORAL_CAPSULE | Freq: Every day | ORAL | 2 refills | 60.00000 days | Status: AC
Start: 2018-05-03 — End: 2018-08-27

## 2018-06-15 ENCOUNTER — Encounter: Admit: 2018-06-15 | Discharge: 2018-06-15

## 2018-07-03 ENCOUNTER — Encounter: Admit: 2018-07-03 | Discharge: 2018-07-03

## 2018-07-05 MED ORDER — HYDROXYZINE HCL 50 MG PO TAB
ORAL_TABLET | Freq: Four times a day (QID) | ORAL | 1 refills | 30.00000 days | Status: AC | PRN
Start: 2018-07-05 — End: 2018-08-27

## 2018-07-29 ENCOUNTER — Encounter: Admit: 2018-07-29 | Discharge: 2018-07-29

## 2018-07-29 DIAGNOSIS — I10 Essential (primary) hypertension: Principal | ICD-10-CM

## 2018-07-29 MED ORDER — LISINOPRIL 20 MG PO TAB
20 mg | ORAL_TABLET | Freq: Every day | ORAL | 0 refills | Status: AC
Start: 2018-07-29 — End: 2018-10-21

## 2018-08-01 ENCOUNTER — Ambulatory Visit: Admit: 2018-08-01 | Discharge: 2018-08-01

## 2018-08-01 ENCOUNTER — Encounter: Admit: 2018-08-01 | Discharge: 2018-08-01

## 2018-08-01 ENCOUNTER — Ambulatory Visit: Admit: 2018-08-01 | Discharge: 2018-08-01 | Payer: Medicaid Other

## 2018-08-01 DIAGNOSIS — M25559 Pain in unspecified hip: ICD-10-CM

## 2018-08-01 DIAGNOSIS — I10 Essential (primary) hypertension: Principal | ICD-10-CM

## 2018-08-01 DIAGNOSIS — M1711 Unilateral primary osteoarthritis, right knee: ICD-10-CM

## 2018-08-01 DIAGNOSIS — F329 Major depressive disorder, single episode, unspecified: ICD-10-CM

## 2018-08-01 DIAGNOSIS — L409 Psoriasis, unspecified: ICD-10-CM

## 2018-08-01 DIAGNOSIS — F419 Anxiety disorder, unspecified: Principal | ICD-10-CM

## 2018-08-01 DIAGNOSIS — F330 Major depressive disorder, recurrent, mild: ICD-10-CM

## 2018-08-01 DIAGNOSIS — E119 Type 2 diabetes mellitus without complications: ICD-10-CM

## 2018-08-01 DIAGNOSIS — C539 Malignant neoplasm of cervix uteri, unspecified: ICD-10-CM

## 2018-08-01 DIAGNOSIS — K219 Gastro-esophageal reflux disease without esophagitis: ICD-10-CM

## 2018-08-01 DIAGNOSIS — F410 Panic disorder [episodic paroxysmal anxiety] without agoraphobia: ICD-10-CM

## 2018-08-01 DIAGNOSIS — T148XXA Other injury of unspecified body region, initial encounter: ICD-10-CM

## 2018-08-01 DIAGNOSIS — M1990 Unspecified osteoarthritis, unspecified site: ICD-10-CM

## 2018-08-01 DIAGNOSIS — G4733 Obstructive sleep apnea (adult) (pediatric): ICD-10-CM

## 2018-08-01 DIAGNOSIS — A64 Unspecified sexually transmitted disease: ICD-10-CM

## 2018-08-01 DIAGNOSIS — M5030 Other cervical disc degeneration, unspecified cervical region: ICD-10-CM

## 2018-08-01 DIAGNOSIS — R05 Cough: ICD-10-CM

## 2018-08-01 DIAGNOSIS — E785 Hyperlipidemia, unspecified: ICD-10-CM

## 2018-08-01 DIAGNOSIS — R51 Headache: ICD-10-CM

## 2018-08-01 DIAGNOSIS — F429 Obsessive-compulsive disorder, unspecified: ICD-10-CM

## 2018-08-01 DIAGNOSIS — R52 Pain, unspecified: ICD-10-CM

## 2018-08-01 MED ORDER — GLIMEPIRIDE 4 MG PO TAB
4 mg | ORAL_TABLET | Freq: Two times a day (BID) | ORAL | 1 refills | Status: AC
Start: 2018-08-01 — End: 2019-01-18

## 2018-08-01 MED ORDER — NAPROXEN 500 MG PO TAB
500 mg | ORAL_TABLET | Freq: Two times a day (BID) | ORAL | 3 refills | Status: AC
Start: 2018-08-01 — End: 2019-01-18

## 2018-08-02 LAB — BASIC METABOLIC PANEL
Lab: 1.2 mg/dL — ABNORMAL HIGH (ref 0.4–1.00)
Lab: 142 MMOL/L (ref 137–147)
Lab: 17 mg/dL (ref 7–25)
Lab: 29 MMOL/L (ref 21–30)
Lab: 4.1 MMOL/L (ref 3.5–5.1)
Lab: 47 mL/min — ABNORMAL LOW (ref >60)
Lab: 56 mL/min — ABNORMAL LOW (ref >60)
Lab: 89 mg/dL (ref 70–100)
Lab: 9.3 mg/dL (ref 8.5–10.6)
Lab: 8 (ref 3–12)

## 2018-08-02 LAB — HEMOGLOBIN A1C: Lab: 9.9 % — ABNORMAL HIGH (ref 4.0–6.0)

## 2018-08-05 ENCOUNTER — Ambulatory Visit: Admit: 2018-08-05 | Discharge: 2018-08-06 | Payer: Medicaid Other

## 2018-08-05 ENCOUNTER — Encounter: Admit: 2018-08-05 | Discharge: 2018-08-05

## 2018-08-05 DIAGNOSIS — N289 Disorder of kidney and ureter, unspecified: Principal | ICD-10-CM

## 2018-08-05 MED ORDER — MISCELLANEOUS MEDICAL SUPPLY MISC MISC
Freq: Every day | 11 refills | 1.00000 days | Status: AC
Start: 2018-08-05 — End: 2018-08-05

## 2018-08-05 MED ORDER — MISCELLANEOUS MEDICAL SUPPLY MISC MISC
Freq: Every day | 11 refills | 1.00000 days | Status: AC
Start: 2018-08-05 — End: 2019-11-06

## 2018-08-06 DIAGNOSIS — F411 Generalized anxiety disorder: ICD-10-CM

## 2018-08-06 DIAGNOSIS — F330 Major depressive disorder, recurrent, mild: Principal | ICD-10-CM

## 2018-08-06 DIAGNOSIS — F1320 Sedative, hypnotic or anxiolytic dependence, uncomplicated: ICD-10-CM

## 2018-08-17 ENCOUNTER — Encounter: Admit: 2018-08-17 | Discharge: 2018-08-17

## 2018-08-18 ENCOUNTER — Encounter: Admit: 2018-08-18 | Discharge: 2018-08-18

## 2018-08-19 ENCOUNTER — Encounter: Admit: 2018-08-19 | Discharge: 2018-08-19

## 2018-08-19 DIAGNOSIS — F411 Generalized anxiety disorder: Principal | ICD-10-CM

## 2018-08-22 ENCOUNTER — Encounter: Admit: 2018-08-22 | Discharge: 2018-08-22

## 2018-08-27 MED ORDER — HYDROXYZINE HCL 50 MG PO TAB
ORAL_TABLET | Freq: Four times a day (QID) | ORAL | 0 refills | 30.00000 days | Status: AC | PRN
Start: 2018-08-27 — End: 2019-01-18

## 2018-08-27 MED ORDER — DULOXETINE 60 MG PO CPDR
ORAL_CAPSULE | Freq: Every day | ORAL | 0 refills | 60.00000 days | Status: AC
Start: 2018-08-27 — End: ?

## 2018-08-29 ENCOUNTER — Encounter: Admit: 2018-08-29 | Discharge: 2018-08-29

## 2018-08-29 MED ORDER — HYDROXYZINE HCL 50 MG PO TAB
50 mg | ORAL_TABLET | Freq: Four times a day (QID) | ORAL | 0 refills | 30.00000 days | Status: AC | PRN
Start: 2018-08-29 — End: 2019-01-18

## 2018-10-10 ENCOUNTER — Encounter: Admit: 2018-10-10 | Discharge: 2018-10-10

## 2018-10-18 ENCOUNTER — Encounter: Admit: 2018-10-18 | Discharge: 2018-10-18

## 2018-10-20 ENCOUNTER — Encounter: Admit: 2018-10-20 | Discharge: 2018-10-20

## 2018-10-20 DIAGNOSIS — I10 Essential (primary) hypertension: Secondary | ICD-10-CM

## 2018-10-21 MED ORDER — LISINOPRIL 20 MG PO TAB
ORAL_TABLET | Freq: Every day | 0 refills | Status: AC
Start: 2018-10-21 — End: 2019-01-23

## 2018-11-03 ENCOUNTER — Encounter: Admit: 2018-11-03 | Discharge: 2018-11-03

## 2018-11-04 NOTE — Telephone Encounter
Pt went to [facility] yesterday morning for runny nose, cough, fever and chest x-ray was performed that is reported to be normal. Was given albuterol inhaler and told to take either ibuprofen or tylenol. Last dose was an hour ago. She does not have a thermometer so she is unsure what her fever is. Has been drinking a lot of ice water and mountain dew. Reports being told that she for sure has the flu by the ER doc and recs for supportive care were given.  I recommended continuing with supportive care and returning to clinic tomorrow or urgent care for re-evaluation, with the caveat that she should go to the ER if she is unable to keep fluids down, does not fee like her fever is responding to treatment or feels at all like she is worsening.    Hoa Deriso T. Primitivo Gauze, M.D.  Resident Physician, PGY-2  Department of Family Medicine  [facility]  Pager [phone]

## 2018-11-07 ENCOUNTER — Encounter: Admit: 2018-11-07 | Discharge: 2018-11-07

## 2018-11-07 DIAGNOSIS — R52 Pain, unspecified: ICD-10-CM

## 2018-11-07 DIAGNOSIS — T148XXA Other injury of unspecified body region, initial encounter: ICD-10-CM

## 2018-11-07 DIAGNOSIS — K219 Gastro-esophageal reflux disease without esophagitis: ICD-10-CM

## 2018-11-07 DIAGNOSIS — I10 Essential (primary) hypertension: Principal | ICD-10-CM

## 2018-11-07 DIAGNOSIS — C539 Malignant neoplasm of cervix uteri, unspecified: ICD-10-CM

## 2018-11-07 DIAGNOSIS — F419 Anxiety disorder, unspecified: ICD-10-CM

## 2018-11-07 DIAGNOSIS — G4733 Obstructive sleep apnea (adult) (pediatric): ICD-10-CM

## 2018-11-07 DIAGNOSIS — F429 Obsessive-compulsive disorder, unspecified: ICD-10-CM

## 2018-11-07 DIAGNOSIS — M5030 Other cervical disc degeneration, unspecified cervical region: ICD-10-CM

## 2018-11-07 DIAGNOSIS — E785 Hyperlipidemia, unspecified: ICD-10-CM

## 2018-11-07 DIAGNOSIS — R51 Headache: ICD-10-CM

## 2018-11-07 DIAGNOSIS — F410 Panic disorder [episodic paroxysmal anxiety] without agoraphobia: ICD-10-CM

## 2018-11-07 DIAGNOSIS — F329 Major depressive disorder, single episode, unspecified: ICD-10-CM

## 2018-11-07 DIAGNOSIS — A64 Unspecified sexually transmitted disease: ICD-10-CM

## 2018-11-07 DIAGNOSIS — M25559 Pain in unspecified hip: ICD-10-CM

## 2018-11-07 DIAGNOSIS — M1990 Unspecified osteoarthritis, unspecified site: ICD-10-CM

## 2018-11-07 DIAGNOSIS — E119 Type 2 diabetes mellitus without complications: ICD-10-CM

## 2018-11-07 MED ORDER — PREDNISONE 20 MG PO TAB
20 mg | ORAL_TABLET | Freq: Every day | ORAL | 0 refills | Status: AC
Start: 2018-11-07 — End: 2019-01-18

## 2018-11-08 ENCOUNTER — Ambulatory Visit: Admit: 2018-11-08 | Discharge: 2018-11-08 | Payer: Medicaid Other

## 2018-11-08 DIAGNOSIS — R062 Wheezing: ICD-10-CM

## 2018-11-08 DIAGNOSIS — J111 Influenza due to unidentified influenza virus with other respiratory manifestations: Principal | ICD-10-CM

## 2018-11-08 NOTE — Progress Notes
???   naproxen (NAPROSYN) 500 mg tablet Take one tablet by mouth twice daily with meals. Take with food.   ??? oxyCODONE (ROXICODONE, OXY-IR) 5 mg tablet Take one tablet by mouth every 4 hours as needed for Pain Earliest Fill Date: 10/14/17   ??? predniSONE (DELTASONE) 20 mg tablet Take one tablet by mouth daily with breakfast.   ??? TURMERIC PO Take  by mouth.     Vitals:    11/07/18 1920   BP: 140/80   BP Source: Arm, Left Upper   Patient Position: Sitting   Pulse: 72   Resp: 16   Temp: 36.9 ???C (98.4 ???F)   TempSrc: Oral   SpO2: 97%   Weight: 94.3 kg (208 lb)   Height: 170.2 cm (67)     Body mass index is 32.58 kg/m???.     Physical Exam  Vitals signs reviewed.   Constitutional:       General: She is not in acute distress.     Appearance: She is well-developed.   HENT:      Head: Normocephalic.      Right Ear: Tympanic membrane and external ear normal.      Left Ear: Tympanic membrane and external ear normal.      Nose: Nose normal.      Mouth/Throat:      Pharynx: No oropharyngeal exudate.   Neck:      Musculoskeletal: Normal range of motion.   Cardiovascular:      Rate and Rhythm: Normal rate and regular rhythm.      Heart sounds: Normal heart sounds.   Pulmonary:      Effort: Pulmonary effort is normal.      Breath sounds: Wheezing present.      Comments: Very mild wheezing.  Skin:     General: Skin is warm.      Capillary Refill: Capillary refill takes less than 2 seconds.   Neurological:      Mental Status: She is alert.              Assessment and Plan:  1. Influenza     2. Wheezing       Patient Instructions   Please use salt water nose spray.  Please take prednisone as directed.  Follow up with your PCP.

## 2018-12-21 ENCOUNTER — Encounter: Admit: 2018-12-21 | Discharge: 2018-12-21

## 2018-12-21 DIAGNOSIS — E1165 Type 2 diabetes mellitus with hyperglycemia: ICD-10-CM

## 2018-12-21 DIAGNOSIS — G8929 Other chronic pain: Principal | ICD-10-CM

## 2018-12-22 MED ORDER — HYDROCHLOROTHIAZIDE 12.5 MG PO TAB
12.5 mg | ORAL_TABLET | Freq: Every morning | ORAL | 1 refills | 30.00000 days | Status: AC
Start: 2018-12-22 — End: 2019-01-18

## 2018-12-22 MED ORDER — ALBUTEROL SULFATE 90 MCG/ACTUATION IN HFAA
2 | RESPIRATORY_TRACT | 0 refills | Status: AC | PRN
Start: 2018-12-22 — End: 2019-07-24

## 2018-12-22 MED ORDER — OXYCODONE 5 MG PO TAB
5 mg | ORAL_TABLET | ORAL | 0 refills | 6.00000 days | Status: AC | PRN
Start: 2018-12-22 — End: 2019-05-24

## 2019-01-17 ENCOUNTER — Encounter: Admit: 2019-01-17 | Discharge: 2019-01-17

## 2019-01-17 NOTE — Telephone Encounter
Patient called stating she has had some allergies that she feels is progressing to something worse. She is taking mucinex and 1 benadryl a day but it is not helping . She denies fever. She is also wanting to talk with Dr. Thedore Mins as she is having tingling down her shoulders from her neck. She states she has a history of back and neck issues. She also wants to discuss her bp medication hctz, as she does not believe it is helping with her leg swelling. Patient signed up for mychart, and scheduled for telehealth visit tomorrow . She verbalizes appreciation for help in setting up mychart and appointment for tomorrow. She has no further questions.

## 2019-01-18 ENCOUNTER — Encounter: Admit: 2019-01-18 | Discharge: 2019-01-18

## 2019-01-18 DIAGNOSIS — M5030 Other cervical disc degeneration, unspecified cervical region: ICD-10-CM

## 2019-01-18 DIAGNOSIS — F429 Obsessive-compulsive disorder, unspecified: ICD-10-CM

## 2019-01-18 DIAGNOSIS — G4733 Obstructive sleep apnea (adult) (pediatric): ICD-10-CM

## 2019-01-18 DIAGNOSIS — F419 Anxiety disorder, unspecified: ICD-10-CM

## 2019-01-18 DIAGNOSIS — I10 Essential (primary) hypertension: Principal | ICD-10-CM

## 2019-01-18 DIAGNOSIS — R51 Headache: ICD-10-CM

## 2019-01-18 DIAGNOSIS — J309 Allergic rhinitis, unspecified: Secondary | ICD-10-CM

## 2019-01-18 DIAGNOSIS — A64 Unspecified sexually transmitted disease: ICD-10-CM

## 2019-01-18 DIAGNOSIS — E119 Type 2 diabetes mellitus without complications: ICD-10-CM

## 2019-01-18 DIAGNOSIS — F410 Panic disorder [episodic paroxysmal anxiety] without agoraphobia: ICD-10-CM

## 2019-01-18 DIAGNOSIS — F329 Major depressive disorder, single episode, unspecified: ICD-10-CM

## 2019-01-18 DIAGNOSIS — C539 Malignant neoplasm of cervix uteri, unspecified: ICD-10-CM

## 2019-01-18 DIAGNOSIS — M25559 Pain in unspecified hip: ICD-10-CM

## 2019-01-18 DIAGNOSIS — M1990 Unspecified osteoarthritis, unspecified site: ICD-10-CM

## 2019-01-18 DIAGNOSIS — E785 Hyperlipidemia, unspecified: ICD-10-CM

## 2019-01-18 DIAGNOSIS — R52 Pain, unspecified: ICD-10-CM

## 2019-01-18 DIAGNOSIS — T148XXA Other injury of unspecified body region, initial encounter: ICD-10-CM

## 2019-01-18 DIAGNOSIS — K219 Gastro-esophageal reflux disease without esophagitis: ICD-10-CM

## 2019-01-18 MED ORDER — CETIRIZINE 10 MG PO TAB
10 mg | ORAL_TABLET | Freq: Every morning | ORAL | 1 refills | Status: DC
Start: 2019-01-18 — End: 2019-07-04

## 2019-01-18 MED ORDER — BLOOD-GLUCOSE METER MISC KIT
PACK | 0 refills | Status: AC
Start: 2019-01-18 — End: ?

## 2019-01-18 MED ORDER — TIZANIDINE 4 MG PO TAB
4 mg | ORAL_TABLET | Freq: Three times a day (TID) | ORAL | 1 refills | Status: DC | PRN
Start: 2019-01-18 — End: 2019-11-06

## 2019-01-18 MED ORDER — FLUTICASONE PROPIONATE 50 MCG/ACTUATION NA SPSN
1 | Freq: Two times a day (BID) | NASAL | 3 refills | 60.00000 days | Status: DC
Start: 2019-01-18 — End: 2020-02-09

## 2019-01-18 MED ORDER — HYDROCHLOROTHIAZIDE 25 MG PO TAB
25 mg | ORAL_TABLET | Freq: Every morning | ORAL | 1 refills | 28.00000 days | Status: DC
Start: 2019-01-18 — End: 2019-05-24

## 2019-01-18 MED ORDER — BLOOD SUGAR DIAGNOSTIC MISC STRP
1 | ORAL_STRIP | Freq: Before meals | 3 refills | 30.00000 days | Status: AC
Start: 2019-01-18 — End: ?

## 2019-01-18 MED ORDER — LANCETS MISC MISC
1 | Freq: Three times a day (TID) | 3 refills | Status: AC
Start: 2019-01-18 — End: ?

## 2019-01-19 ENCOUNTER — Ambulatory Visit: Admit: 2019-01-18 | Discharge: 2019-01-19 | Payer: Medicaid Other

## 2019-01-19 DIAGNOSIS — E1165 Type 2 diabetes mellitus with hyperglycemia: Principal | ICD-10-CM

## 2019-01-19 DIAGNOSIS — R202 Paresthesia of skin: ICD-10-CM

## 2019-01-19 DIAGNOSIS — R200 Anesthesia of skin: Secondary | ICD-10-CM

## 2019-01-19 DIAGNOSIS — F419 Anxiety disorder, unspecified: ICD-10-CM

## 2019-01-19 DIAGNOSIS — F331 Major depressive disorder, recurrent, moderate: Secondary | ICD-10-CM

## 2019-01-19 DIAGNOSIS — R3915 Urgency of urination: ICD-10-CM

## 2019-01-20 ENCOUNTER — Encounter: Admit: 2019-01-20 | Discharge: 2019-01-20

## 2019-01-20 DIAGNOSIS — I10 Essential (primary) hypertension: Principal | ICD-10-CM

## 2019-01-23 MED ORDER — LISINOPRIL 20 MG PO TAB
ORAL_TABLET | Freq: Every day | 0 refills | Status: DC
Start: 2019-01-23 — End: 2019-04-20

## 2019-02-14 ENCOUNTER — Encounter: Admit: 2019-02-14 | Discharge: 2019-02-14

## 2019-02-24 ENCOUNTER — Encounter: Admit: 2019-02-24 | Discharge: 2019-02-24

## 2019-02-26 ENCOUNTER — Encounter: Admit: 2019-02-26 | Discharge: 2019-02-26

## 2019-02-26 ENCOUNTER — Ambulatory Visit: Admit: 2019-02-26 | Discharge: 2019-02-27 | Payer: Medicaid Other

## 2019-02-26 DIAGNOSIS — T148XXA Other injury of unspecified body region, initial encounter: ICD-10-CM

## 2019-02-26 DIAGNOSIS — F329 Major depressive disorder, single episode, unspecified: ICD-10-CM

## 2019-02-26 DIAGNOSIS — E785 Hyperlipidemia, unspecified: ICD-10-CM

## 2019-02-26 DIAGNOSIS — K219 Gastro-esophageal reflux disease without esophagitis: ICD-10-CM

## 2019-02-26 DIAGNOSIS — M1990 Unspecified osteoarthritis, unspecified site: ICD-10-CM

## 2019-02-26 DIAGNOSIS — M5030 Other cervical disc degeneration, unspecified cervical region: ICD-10-CM

## 2019-02-26 DIAGNOSIS — M25559 Pain in unspecified hip: ICD-10-CM

## 2019-02-26 DIAGNOSIS — R51 Headache: ICD-10-CM

## 2019-02-26 DIAGNOSIS — F419 Anxiety disorder, unspecified: ICD-10-CM

## 2019-02-26 DIAGNOSIS — F410 Panic disorder [episodic paroxysmal anxiety] without agoraphobia: ICD-10-CM

## 2019-02-26 DIAGNOSIS — I10 Essential (primary) hypertension: Principal | ICD-10-CM

## 2019-02-26 DIAGNOSIS — F429 Obsessive-compulsive disorder, unspecified: ICD-10-CM

## 2019-02-26 DIAGNOSIS — G4733 Obstructive sleep apnea (adult) (pediatric): ICD-10-CM

## 2019-02-26 DIAGNOSIS — C539 Malignant neoplasm of cervix uteri, unspecified: ICD-10-CM

## 2019-02-26 DIAGNOSIS — A64 Unspecified sexually transmitted disease: ICD-10-CM

## 2019-02-26 DIAGNOSIS — E119 Type 2 diabetes mellitus without complications: ICD-10-CM

## 2019-02-26 DIAGNOSIS — R52 Pain, unspecified: ICD-10-CM

## 2019-02-26 MED ORDER — NITROFURANTOIN MONOHYD/M-CRYST 100 MG PO CAP
100 mg | ORAL_CAPSULE | Freq: Two times a day (BID) | ORAL | 0 refills | 7.00000 days | Status: AC
Start: 2019-02-26 — End: ?

## 2019-02-26 NOTE — Progress Notes
Date of Service: 02/26/2019    Jessica Colon is a 53 y.o. female.  DOB: [birth year]  MRN: [id number]     Subjective:              Chief Complaint   Patient presents with   ??? Urinary Pain       History of Present Illness  Patient presents with low back pain and malodorous urine for the past couple days. Referred to UC by PCP. She is also a diabetic and checks her glucose readings at home taking Glimepiride which was increased recently. Denies fever, chills, shortness of breath, cough, nausea, vomiting, diarrhea, abd pain, dysuria, weakness or AMS. Reports intermittent incontinence, malodorous urine, low back pain and increased urinary frequency. No aggravating or alleviating factors.          Review of Systems   Constitutional: Negative for chills and fever.   HENT: Negative for ear pain and sore throat.    Eyes: Negative for visual disturbance.   Respiratory: Negative for cough, chest tightness and shortness of breath.    Cardiovascular: Negative for chest pain.   Gastrointestinal: Negative for diarrhea, nausea and vomiting.   Genitourinary: Positive for frequency and urgency. Negative for decreased urine volume, difficulty urinating, dysuria, flank pain, vaginal bleeding, vaginal discharge and vaginal pain.   Musculoskeletal: Positive for back pain. Negative for myalgias.   Neurological: Negative for weakness and headaches.         Objective:         ??? acetaminophen (TYLENOL) 325 mg tablet Take 2 Tabs by mouth every 4 hours as needed.   ??? albuterol sulfate (PROAIR HFA) 90 mcg/actuation aerosol inhaler Inhale two puffs by mouth into the lungs every 6 hours as needed for Wheezing or Shortness of Breath. Shake well before use.   ??? blood sugar diagnostic test strip Use one strip as directed before meals and at bedtime.   ??? Blood-Glucose Meter kit Use as directed,   ??? cetirizine (ZYRTEC) 10 mg tablet Take one tablet by mouth every morning. If you develop a fever, back pain, pain with urination, frequent urination, or are not able to make it to the bathroom when you feel the urge to urinate, you should be seen by a physician or emergency room.    If symptoms worsen or do not improve within 3 days please seek treatment with primary care.         Bladder Infection,???Female (Adult)    A bladder infection (cystitis or UTI) usually causes a constant urge to urinate and a burning when passing urine. Urine may be cloudy, smelly or dark. There may be pain in the lower abdomen. A bladder infection occurs when bacteria from the vaginal area enter the bladder opening (urethra). This can occur from sexual intercourse, wearing tight clothing, dehydration and other factors.  Home Care:  ??? Drink lots of fluids (at least 6-8 glasses a day, unless you must restrict fluids for other medical reasons). This will force the medicine into your urinary system and flush the bacteria out of your body.  ??? Avoid sexual intercourse until your symptoms are gone.  ??? Avoid caffeine, alcohol and spicy foods. These can irritate the bladder.  ??? A bladder infection is treated with antibiotics. You may also be given Pyridium (generic = phenazopyridine) to reduce the burning sensation. This medicine will cause your urine to become a bright orange color. The orange urine may stain clothing. You may wear a pad or panty-liner to protect  clothing.  Preventing Future Infections:  ??? Always wipe from front to back after a bowel movement.  ??? Keep the genital area clean and dry.  ??? Drink plenty of fluids each day to avoid dehydration.  ??? Both sexual partners should wash before intercourse.  ??? Urinate right after intercourse to flush out the bladder.  ??? Wear cotton underwear and cotton-lined panty hose; avoid tight-fitting pants.  ??? If you are on birth control pills and are having frequent bladder infections, discuss with your doctor.  Follow Up:

## 2019-02-27 DIAGNOSIS — R319 Hematuria, unspecified: ICD-10-CM

## 2019-02-27 DIAGNOSIS — M545 Low back pain: Principal | ICD-10-CM

## 2019-02-27 DIAGNOSIS — N390 Urinary tract infection, site not specified: ICD-10-CM

## 2019-02-28 ENCOUNTER — Encounter: Admit: 2019-03-01 | Discharge: 2019-03-01

## 2019-03-01 ENCOUNTER — Encounter: Admit: 2019-03-01 | Discharge: 2019-03-01

## 2019-03-01 DIAGNOSIS — M545 Low back pain: Secondary | ICD-10-CM

## 2019-03-01 DIAGNOSIS — M5412 Radiculopathy, cervical region: Secondary | ICD-10-CM

## 2019-03-01 NOTE — Telephone Encounter
pt was seen in UC 02/26/19 she received a my chart message for results but is unable to log in at this time I informed her it was the result of the urine POC performed in the clinic that the provider discussed with her, the urine culture was still pending at this time she requested we call her with the results she is unable to find her my chart information right now I agreed to call her with the results

## 2019-03-01 NOTE — Telephone Encounter
Patient called requesting new MRI C Spine WO Contrast order. Last office visit 01/04/2018 when order was placed. Has expired. Set up patient for follow up office visit for 03/28/2019. Patient stating she would like MRI at [facility] and has required sedation in the past for MRI. Will route message for Dr. Tamala Julian review. Patient verbalized understanding.

## 2019-03-02 ENCOUNTER — Encounter: Admit: 2019-03-02 | Discharge: 2019-03-02

## 2019-03-02 LAB — CULTURE-URINE W/SENSITIVITY: Lab: <10

## 2019-03-02 NOTE — Telephone Encounter
Patient called stating that she feels her numbness and tingling bilateral arms is worsening. Last seen 01/04/18. Patient stating that she felt like surgery was suggested/necessary at that time, however was not able to follow up on the testing or MRI ordered at that time. Patient tearful and concerned that she "will be paralyzed" from this. Patient has follow up scheduled for 03/09/2019. Will follow up with provider to see if patient able to come in sooner than scheduled. Patient verbalized understanding.

## 2019-03-02 NOTE — Telephone Encounter
contacted pt informed her per Catalina Gravel PA-C her urine culture was negative she should complete the antibiotic prescribed and follow up with her PCP if sx's continue sh stated she understood

## 2019-03-03 ENCOUNTER — Encounter: Admit: 2019-03-03 | Discharge: 2019-03-03

## 2019-03-03 NOTE — Progress Notes
Let patient know urine culture was negative. If still with symptoms, follow up with PCP.

## 2019-03-07 ENCOUNTER — Encounter: Admit: 2019-03-07 | Discharge: 2019-03-07

## 2019-03-07 ENCOUNTER — Emergency Department: Admit: 2019-03-07 | Discharge: 2019-03-07

## 2019-03-07 ENCOUNTER — Ambulatory Visit: Admit: 2019-03-07 | Discharge: 2019-03-08

## 2019-03-07 ENCOUNTER — Emergency Department: Admit: 2019-03-07 | Discharge: 2019-03-08 | Disposition: A | Discharge status: Home / Self Care

## 2019-03-07 DIAGNOSIS — M5030 Other cervical disc degeneration, unspecified cervical region: Secondary | ICD-10-CM

## 2019-03-07 DIAGNOSIS — F419 Anxiety disorder, unspecified: Secondary | ICD-10-CM

## 2019-03-07 DIAGNOSIS — F410 Panic disorder [episodic paroxysmal anxiety] without agoraphobia: Secondary | ICD-10-CM

## 2019-03-07 DIAGNOSIS — I10 Essential (primary) hypertension: Secondary | ICD-10-CM

## 2019-03-07 DIAGNOSIS — F329 Major depressive disorder, single episode, unspecified: Secondary | ICD-10-CM

## 2019-03-07 DIAGNOSIS — R269 Unspecified abnormalities of gait and mobility: Secondary | ICD-10-CM

## 2019-03-07 DIAGNOSIS — R51 Headache: Secondary | ICD-10-CM

## 2019-03-07 DIAGNOSIS — A64 Unspecified sexually transmitted disease: Secondary | ICD-10-CM

## 2019-03-07 DIAGNOSIS — R52 Pain, unspecified: Secondary | ICD-10-CM

## 2019-03-07 DIAGNOSIS — F429 Obsessive-compulsive disorder, unspecified: Secondary | ICD-10-CM

## 2019-03-07 DIAGNOSIS — R200 Anesthesia of skin: Secondary | ICD-10-CM

## 2019-03-07 DIAGNOSIS — C539 Malignant neoplasm of cervix uteri, unspecified: Secondary | ICD-10-CM

## 2019-03-07 DIAGNOSIS — M1990 Unspecified osteoarthritis, unspecified site: Secondary | ICD-10-CM

## 2019-03-07 DIAGNOSIS — T148XXA Other injury of unspecified body region, initial encounter: Secondary | ICD-10-CM

## 2019-03-07 DIAGNOSIS — E119 Type 2 diabetes mellitus without complications: Secondary | ICD-10-CM

## 2019-03-07 DIAGNOSIS — K219 Gastro-esophageal reflux disease without esophagitis: Secondary | ICD-10-CM

## 2019-03-07 DIAGNOSIS — R32 Unspecified urinary incontinence: Secondary | ICD-10-CM

## 2019-03-07 DIAGNOSIS — G4733 Obstructive sleep apnea (adult) (pediatric): Secondary | ICD-10-CM

## 2019-03-07 DIAGNOSIS — M25559 Pain in unspecified hip: Secondary | ICD-10-CM

## 2019-03-07 DIAGNOSIS — M4802 Spinal stenosis, cervical region: Secondary | ICD-10-CM

## 2019-03-07 DIAGNOSIS — E785 Hyperlipidemia, unspecified: Secondary | ICD-10-CM

## 2019-03-07 LAB — CBC AND DIFF
Lab: 0 10*3/uL (ref 0–0.20)
Lab: 0 10*3/uL (ref 0–0.45)
Lab: 0.4 10*3/uL (ref 0–0.80)
Lab: 1 % (ref 0–2)
Lab: 1 % (ref 0–5)
Lab: 3 10*3/uL (ref 1.0–4.8)
Lab: 4 10*3/uL (ref 1.8–7.0)
Lab: 6 % (ref 4–12)
Lab: 7.6 10*3/uL (ref 4.5–11.0)

## 2019-03-07 LAB — URINALYSIS DIPSTICK
Lab: NEGATIVE
Lab: NEGATIVE
Lab: NEGATIVE
Lab: NEGATIVE

## 2019-03-07 LAB — URINALYSIS, MICROSCOPIC

## 2019-03-07 LAB — BASIC METABOLIC PANEL
Lab: 0.9 mg/dL (ref 0.4–1.00)
Lab: 10 mg/dL (ref 8.5–10.6)
Lab: 100 MMOL/L (ref 98–110)
Lab: 11 pg (ref 3–12)
Lab: 134 MMOL/L — ABNORMAL LOW (ref 137–147)
Lab: 19 mg/dL (ref 7–25)
Lab: 23 MMOL/L (ref 21–30)
Lab: 239 mg/dL — ABNORMAL HIGH (ref 70–100)
Lab: 3.7 MMOL/L (ref 3.5–5.1)
Lab: >60 mL/min (ref >60)
Lab: >60 mL/min (ref >60)

## 2019-03-07 MED ORDER — PATCH DOCUMENTATION - NICOTINE 14 MG/24HR
Freq: Two times a day (BID) | TRANSDERMAL | 0 refills | Status: DC
Start: 2019-03-07 — End: 2019-03-08

## 2019-03-07 MED ORDER — LORAZEPAM 1 MG PO TAB
1 mg | Freq: Once | ORAL | 0 refills | Status: CP
Start: 2019-03-07 — End: ?
  Administered 2019-03-07: 22:00:00 1 mg via ORAL

## 2019-03-07 MED ORDER — NICOTINE 14 MG/24 HR TD PT24
1 | Freq: Every day | TRANSDERMAL | 0 refills | Status: DC
Start: 2019-03-07 — End: 2019-03-08
  Administered 2019-03-07: 20:00:00 1 via TRANSDERMAL

## 2019-03-07 NOTE — ED Notes
Pt tearful and reporting "I want to leave. I can't stay here and wait for this MRI all day. I'm hungry and I need to smoke a cigarette." Pt educated on inability to provided food/water at this time due to scheduled imaging. Pt also educated on inability to leave department to smoke. Pt continues to state "I want to go home." MD notified.

## 2019-03-07 NOTE — ED Provider Notes
Jessica Colon is a 53 y.o. female.    Chief Complaint:  Chief Complaint   Patient presents with   ??? Imaging     [facility]; MD states that they belive she has a possible spine compression; pt's insurance takes 3 weeks to approve imaging tests; MD states she may need surgery and states she can't wait       History of Present Illness:  53 year old female that presents with progressive numbness of her bilateral upper extremities as well as some occasional urinary continence that presents at the recommendation of her PM&R physician for MRI.  Patient was seen earlier in the clinic and was complaining of progressing worsening.  Her physician was concerned that she may be developing spinal compression.  He was concerned that she needs an MRI on an emergent basis as opposed to electively as an outpatient.  He had tried to arrange for an elective outpatient procedure but was unsuccessful due to insurance constraints.  Patient does have a history of prior cyst spinal surgery by neurosurgery.  Patient denies any numbness or weakness in her lower extremities.  She is primarily complaining of numbness in the lateral aspect of her bilateral forearms.  She does report the right is greater than left.  She denies any falls or trauma.          Review of Systems:  Review of Systems   Constitutional: Negative for activity change, appetite change and fever.   HENT: Negative for congestion, drooling, rhinorrhea and tinnitus.    Respiratory: Negative for cough, chest tightness and shortness of breath.    Cardiovascular: Negative for chest pain, palpitations and leg swelling.   Gastrointestinal: Negative for abdominal pain, constipation, diarrhea, nausea and vomiting.   Genitourinary: Negative for dysuria, frequency and urgency.   Musculoskeletal: Negative for arthralgias.   Skin: Negative for rash.   Neurological: Positive for weakness and numbness. Negative for dizziness, syncope, light-headedness and headaches.   Hematological: Negative for adenopathy.   All other systems reviewed and are negative.      Allergies:  Pcn [penicillins]; Clindamycin; Buspirone; Gabapentin; Hydrocodone; Melatonin; Meloxicam; Mirtazapine; Niacin; and Wellbutrin [bupropion]    Past Medical History:  Medical History:   Diagnosis Date   ??? Anxiety    ??? Arthritis    ??? 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 Chronic generalized pain    ??? DDD (degenerative disc disease), cervical    ??? Depression     Depression and Anxiety   ??? DM2 (diabetes mellitus, type 2) (HCC)    ??? Generalized headaches    ??? GERD (gastroesophageal reflux disease)    ??? Hip pain    ??? HLD (hyperlipidemia)    ??? HTN (hypertension)    ??? Nerve injury    ??? OCD (obsessive compulsive disorder)    ??? OSA (obstructive sleep apnea)    ??? Panic attacks    ??? Sexually transmitted disease     HX of HPV       Past Surgical History:  Surgical History:   Procedure Laterality Date   ??? TONSILLECTOMY  1973   ??? CHOLECYSTECTOMY  1990   ??? TUBAL LIGATION  1991   ??? ANTERIOR CERVICAL 5-7 FUSION N/A 11/18/2015    Performed by Catarina Hartshorn, MD at [facility] OR   ??? VERTEBRECTOMY CERVICAL 6 N/A 11/18/2015    Performed by Catarina Hartshorn, MD at [facility] OR   ??? HX TOTAL ABDOMINAL HYSTERECTOMY  2/2 cervical ca       Pertinent medical/surgical history reviewed    Social History:  Social History     Tobacco Use   ??? Smoking status: Current Every Day Smoker     Packs/day: 1.00     Years: 38.00     Pack years: 38.00     Types: Cigarettes   ??? Smokeless tobacco: Never Used   ??? Tobacco comment: pt declined   Substance Use Topics   ??? Alcohol use: No     Alcohol/week: 0.0 standard drinks   ??? Drug use: No     Social History     Substance and Sexual Activity   Drug Use No       Family History:  Family History   Problem Relation Age of Onset   ??? Arthritis Mother    ??? Joint Pain Mother    ??? Hypertension Mother    ??? Heart problem Father    ??? Cancer Maternal Grandfather         bladder ??? Diabetes Maternal Grandfather    ??? Heart problem Brother    ??? Lung Disease Brother        Vitals:  ED Vitals    Date and Time T BP P RR SPO2P SPO2 User   03/07/19 1216 36.7 ???C (98.1 ???F) 145/75 -- 16 PER MINUTE 102 97 % HT          Physical Exam:  Physical Exam  Vitals signs and nursing note reviewed.   Constitutional:       General: She is not in acute distress.     Appearance: She is well-developed.   HENT:      Head: Normocephalic and atraumatic.   Eyes:      Conjunctiva/sclera: Conjunctivae normal.   Neck:      Musculoskeletal: Neck supple.   Cardiovascular:      Rate and Rhythm: Normal rate and regular rhythm.      Heart sounds: Normal heart sounds.   Pulmonary:      Effort: No respiratory distress.      Breath sounds: No wheezing or rales.   Abdominal:      General: Bowel sounds are normal. There is no distension.      Palpations: Abdomen is soft.      Tenderness: There is no abdominal tenderness.   Musculoskeletal: Normal range of motion.   Skin:     General: Skin is warm and dry.      Capillary Refill: Capillary refill takes less than 2 seconds.      Findings: No erythema.   Neurological:      Mental Status: She is alert.      Sensory: Sensory deficit present.      Comments: 5 out of 5 strength in upper and lower extremities bilaterally.  Numbness over the lateral aspect of the forearms bilaterally with right greater than left.  Reflexes are intact and are 3+.  Per report of patient no saddle anesthesia.         Laboratory Results:  Labs Reviewed   CBC AND DIFF   BASIC METABOLIC PANEL   URINALYSIS DIPSTICK   URINALYSIS, MICROSCOPIC          Radiology Interpretation:    MRI C-SPINE WO CONTRAST    (Results Pending)         EKG:    None    ED Course:  Patient was evaluated for upper extremity weakness and pain with concern for spinal compression  per her referring physician.  Patient on exam does have lateral numbness of her bilateral forearms.  Patient is able to ambulate however previous encounter notes with referring physician indicates that she had difficulty ambulating.  Patient does require wheelchair more for overall comfort and ease of mobility.  Will obtain MRI C-spine.  Patient care transitioned to Dr. Manson Passey while awaiting MRI for determination of disposition.  Per review of notes if only foraminal stenosis will follow-up as outpatient.  If significant compression we will contact neurosurgery for consultation.       ED Scoring:                             MDM  Reviewed: previous chart, nursing note and vitals  Reviewed previous: labs  Interpretation: labs and MRI        Facility Administered Meds:  Medications - No data to display      Clinical Impression:  Clinical Impression   Spinal stenosis of cervical region   Arm numbness       Disposition/Follow up  ED Disposition     None        No follow-up provider specified.    Medications:  New Prescriptions    No medications on file       Procedure Notes:  Procedures      Attestation / Supervision:  I personally performed the E/M including history, physical exam, and MDM.      Ellyn Hack, MD

## 2019-03-07 NOTE — ED Notes
MRI screening questions asked at this time.

## 2019-03-07 NOTE — Progress Notes
[facility] CLINIC NOTE       SUBJECTIVE:  Jessica Colon is a 53 year old female with history of diabetes and a C5-C7 ACDF by Dr. Debroah Loop in February 2017, who presents with increasing right upper extremity weakness and progressive gait abnormality and balance difficulties.  I last saw the patient in April 2019, at which point I was concerned she had developed upper motor neuron signs and recommended MRI to differentiate between worsening radiculopathy and cervical myelopathy.  Since that time the patient has not proceeded with MRI.  She contacted my office earlier this week concerned that her balance had worsened as well as her weakness.  We recommended getting an MRI, but she was unable to coordinate.  She presents today in clinic with concerns that she is becoming increasingly unsteady and requires this stand next to others so that she does not fall.  VAS pain score is rated a 7/10.  Temporally, her pain is constant, but can fluctuate with intensity.  She has had increasing balance difficulties as well as right upper extremity weakness.  She reports some intermittent bladder incontinence, but no bowel incontinence.  She also reports some low back pain which is of secondary concern.         Review of Systems   Constitutional: Positive for activity change and fatigue.   HENT: Positive for congestion, rhinorrhea and sneezing.    Eyes: Positive for visual disturbance.   Respiratory: Positive for wheezing.    Cardiovascular: Positive for leg swelling.   Gastrointestinal: Positive for nausea.   Endocrine: Positive for cold intolerance and heat intolerance.   Genitourinary: Negative.    Musculoskeletal: Positive for arthralgias, back pain, gait problem, neck pain and neck stiffness.   Skin: Negative.    Allergic/Immunologic: Negative.    Neurological: Positive for dizziness, weakness and headaches.   Hematological: Negative.    Psychiatric/Behavioral: Positive for agitation, dysphoric mood and sleep disturbance. The patient is nervous/anxious.    All other systems reviewed and are negative.      Current Outpatient Medications:   ???  acetaminophen (TYLENOL) 325 mg tablet, Take 2 Tabs by mouth every 4 hours as needed., Disp: , Rfl: 0  ???  albuterol sulfate (PROAIR HFA) 90 mcg/actuation aerosol inhaler, Inhale two puffs by mouth into the lungs every 6 hours as needed for Wheezing or Shortness of Breath. Shake well before use., Disp: 25.5 g, Rfl: 0  ???  blood sugar diagnostic test strip, Use one strip as directed before meals and at bedtime., Disp: 300 strip, Rfl: 3  ???  Blood-Glucose Meter kit, Use as directed,, Disp: 1 kit, Rfl: 0  ???  cetirizine (ZYRTEC) 10 mg tablet, Take one tablet by mouth every morning., Disp: 90 tablet, Rfl: 1  ???  cholecalciferol(+) (Vitamin D3) 50,000 units capsule, Take one capsule by mouth every 7 days., Disp: 12 capsule, Rfl: 0  ???  clobetasol (TEMOVATE) 0.05 % topical cream, APPLY CREAM TOPICALLY TO AFFECTED AREA TWICE DAILY, Disp: 30 g, Rfl: 1  ???  clonazePAM (KLONOPIN) 1 mg tablet, Take 1 tablet by mouth three times daily., Disp: , Rfl:   ???  duloxetine DR (CYMBALTA) 60 mg capsule, TAKE 2 CAPSULES BY MOUTH ONCE DAILY, Disp: 60 capsule, Rfl: 0  ???  famotidine (HEARTBURN RELIEF (FAMOTIDINE)) 10 mg tablet, Take 10 mg by mouth twice daily., Disp: , Rfl:   ???  fish oil/E/fatty acid5/herb137 (FLAX, FISH AND BORAGE OIL PO), Take  by mouth., Disp: , Rfl:   ???  fluticasone  propionate (FLONASE) 50 mcg/actuation nasal spray, suspension, Apply one spray to each nostril as directed twice daily. Shake bottle gently before using., Disp: 16 g, Rfl: 3  ???  hydroCHLOROthiazide (HYDRODIURIL) 25 mg tablet, Take one tablet by mouth every morning., Disp: 90 tablet, Rfl: 1  ???  lancets MISC, Use one each as directed three times daily before meals. Diag: DM2, Disp: 300 each, Rfl: 3  ???  lisinopriL (ZESTRIL) 20 mg tablet, Take 1 tablet by mouth once daily, Disp: 90 tablet, Rfl: 0 ???  Miscellaneous [employer] misc, Premier protein shakes  Use as directed for meal replacement daily #30, 11 refills, Disp: 30 each, Rfl: 11  ???  oxyCODONE (ROXICODONE) 5 mg tablet, Take one tablet by mouth every 4 hours as needed for Pain, Disp: 30 tablet, Rfl: 0  ???  tiZANidine (ZANAFLEX) 4 mg tablet, Take one tablet by mouth three times daily as needed., Disp: 30 tablet, Rfl: 1  ???  TURMERIC PO, Take  by mouth., Disp: , Rfl:   Allergies   Allergen Reactions   ??? Pcn [Penicillins] ANAPHYLAXIS     Pt reports it'll kill me, rapid heart rate and I won't breathe.   ??? Clindamycin EDEMA   ??? Buspirone HEADACHE   ??? Gabapentin ITCHING   ??? Hydrocodone SEE COMMENTS     Pt states it makes me feel strange, like I'm out of control of myself.   ??? Melatonin HEADACHE   ??? Meloxicam SEE COMMENTS     Went to hospital for SOA and rapid heart beat   ??? Mirtazapine SEE COMMENTS     Medication interaction    ??? Niacin SEE COMMENTS     Pt states I get really really hot and I feel strange.   ??? Wellbutrin [Bupropion] ITCHING     Physical Exam  Vitals:    03/07/19 1102   BP: 125/70   BP Source: Arm, Left Upper   Patient Position: Sitting   Pulse: 105   Resp: 20   Temp: 36.9 ???C (98.5 ???F)   TempSrc: Oral   SpO2: 98%   Weight: 100.2 kg (221 lb)   Height: 170.2 cm (67)   PainSc: Seven        Pain Score: Seven  Body mass index is 34.61 kg/m???.  General: 53 y.o. female appears stated age, in no acute distress  HEENT: Normocephalic, atraumatic  Neck: No thyroidmegaly  Cardiovascular: Well perfused  Pulmonary: Unlabored respirations  Extremities: No cyanosis, clubbing, or edema  Skin: Warm and dry  Psychiatric:  Appropriate mood and affect  Musculoskeletal: No atrophy noted.  Decreased range of motion with side bending and lateral rotation. Tender to palpation at bilateral cervical paraspinals and trapezius.  Facet loading is negative.   Neurologic: Right C6 myotome is 4/5, right C7 myotome is 4/5, otherwise upper extremity myotomes are all 5/5.  Upper extremity dermatomes are all intact to light touch.  Deep tendon reflexes are symmetric at biceps, triceps, and brachioradialis.  Hoffmann's is grossly positive on the right and weakly positive on the left.  Spurlings is positive on the right.. No ankle clonus.   Gait: Patient is unsteady with narrow based gait.  There is increased hip adduction with stride past midline.  There is unsteadiness and need for reactive truncal rotation to assist with standing upright.    Diagnostics:  CT cervical spine from 05/27/2017 demonstrated C5 T7 ACDF and C6 corpectomy.  There is posterior disc osteophyte complex and ligamentum flavum hypertrophy at C5-C6 resulting  moderate central canal stenosis.  There is severe neuroforaminal stenosis bilaterally at C5-C6 and to lesser degree on the left at C4-C5.       IMPRESSION:  1. Cervical stenosis of spine    2. History of fusion of cervical spine    3. Gait abnormality    Jessica Colon is a 53 year old female with history of C5-C7 ACDF in February 2017 by Dr. Debroah Loop, who presents with increasing upper extremity weakness, numbness, and balance difficulties.  Patient demonstrates increasing cervical radiculopathy, but given upper motor neuron signs seen on examination today, balance difficulties and tendency to fall, as well as decreasing neurologic status, I am concerned that she could have developing cervical myelopathy.      PLAN:    1.  Lifestyle modification.  Recommend activity as tolerated.  She probably would benefit from use of a Rollator.  I am happy to write a prescription if needed.  2.  Medication.  Will defer at this time.  3.  Therapy.  Will defer at this time.  4.  Interventions.  If imaging only demonstrates neural foraminal stenosis, she would be candidate for cervical epidural assuming the central stenosis is not severe.  5.  Diagnostics.  Given her worsening neurologic status and physical examination today, I would recommend an MRI of cervical spine soon as possible.  If she is feeling seen to be myelopathic, we will need to coordinate with our spine surgeons to have decompressive surgery.  6.  Discharge.  I have coordinated with the emergency department and would like to have her seen in the ED so that we can get MRI of cervical spine.  If there is no evidence of myelopathy, severe cord compression, or spinal cord edema, I am happy to provide Rollator and injection.  At that point we would probably need to work-up the low back to evaluate for the reason of her gait abnormality and balance difficulties.

## 2019-03-08 DIAGNOSIS — M4802 Spinal stenosis, cervical region: Secondary | ICD-10-CM

## 2019-03-08 DIAGNOSIS — Z981 Arthrodesis status: Secondary | ICD-10-CM

## 2019-03-09 ENCOUNTER — Encounter: Admit: 2019-03-09 | Discharge: 2019-03-09

## 2019-03-09 DIAGNOSIS — M542 Cervicalgia: Secondary | ICD-10-CM

## 2019-03-09 DIAGNOSIS — M545 Low back pain: Secondary | ICD-10-CM

## 2019-03-09 NOTE — Telephone Encounter
ED Discharge Follow Up  Reached patient: Seen in clinic 24-48 hours post discharge; scheduled with Ortho 03/10/2019  Admission [facility] Name : [facility]  ED Admission Date: 03/07/19 ED Discharge Date: 03/07/19 Admission Diagnosis: Pain and Numbness  Discharge Diagnosis   Spinal stenosis of cervical region   Arm numbness   Hospital Services: Unplanned  Today's call is 2 (calendar) days post discharge    Scheduling Follow-up Appointment  Upcoming appointment date and time and with whom scheduled:   Future Appointments   Date Time Provider [place]   03/10/2019  3:30 PM Legrand Pitts, MD [place]     When was patients last PCP visit: 01/18/2019  PCP primary location: [place]  PCP appointment scheduled? No, f/u with Ortho  Specialist appointment scheduled? Yes, with Orthopedic 03/10/2019  Both PCP and Specialist appointment scheduled: No  Is assistance with transportation needed? No    ED Communication   Did Pt call Clinic prior to going to ED? Yes Referred to ED by clinic/provider  Reason patient went to [id number] by clinic/provider    Almira Bar, MA

## 2019-03-10 ENCOUNTER — Ambulatory Visit: Admit: 2019-03-10 | Discharge: 2019-03-10

## 2019-03-10 ENCOUNTER — Encounter: Admit: 2019-03-10 | Discharge: 2019-03-10

## 2019-03-10 DIAGNOSIS — F410 Panic disorder [episodic paroxysmal anxiety] without agoraphobia: Secondary | ICD-10-CM

## 2019-03-10 DIAGNOSIS — M5030 Other cervical disc degeneration, unspecified cervical region: Secondary | ICD-10-CM

## 2019-03-10 DIAGNOSIS — C539 Malignant neoplasm of cervix uteri, unspecified: Secondary | ICD-10-CM

## 2019-03-10 DIAGNOSIS — M5136 Other intervertebral disc degeneration, lumbar region: Secondary | ICD-10-CM

## 2019-03-10 DIAGNOSIS — E785 Hyperlipidemia, unspecified: Secondary | ICD-10-CM

## 2019-03-10 DIAGNOSIS — R51 Headache: Secondary | ICD-10-CM

## 2019-03-10 DIAGNOSIS — M542 Cervicalgia: Secondary | ICD-10-CM

## 2019-03-10 DIAGNOSIS — A64 Unspecified sexually transmitted disease: Secondary | ICD-10-CM

## 2019-03-10 DIAGNOSIS — M545 Low back pain: Principal | ICD-10-CM

## 2019-03-10 DIAGNOSIS — M48061 Spinal stenosis, lumbar region without neurogenic claudication: Secondary | ICD-10-CM

## 2019-03-10 DIAGNOSIS — T148XXA Other injury of unspecified body region, initial encounter: Secondary | ICD-10-CM

## 2019-03-10 DIAGNOSIS — M4802 Spinal stenosis, cervical region: Secondary | ICD-10-CM

## 2019-03-10 DIAGNOSIS — M1990 Unspecified osteoarthritis, unspecified site: Secondary | ICD-10-CM

## 2019-03-10 DIAGNOSIS — F419 Anxiety disorder, unspecified: Secondary | ICD-10-CM

## 2019-03-10 DIAGNOSIS — R52 Pain, unspecified: Secondary | ICD-10-CM

## 2019-03-10 DIAGNOSIS — F329 Major depressive disorder, single episode, unspecified: Secondary | ICD-10-CM

## 2019-03-10 DIAGNOSIS — M25559 Pain in unspecified hip: Secondary | ICD-10-CM

## 2019-03-10 DIAGNOSIS — G4733 Obstructive sleep apnea (adult) (pediatric): Secondary | ICD-10-CM

## 2019-03-10 DIAGNOSIS — M4712 Other spondylosis with myelopathy, cervical region: Secondary | ICD-10-CM

## 2019-03-10 DIAGNOSIS — K219 Gastro-esophageal reflux disease without esophagitis: Secondary | ICD-10-CM

## 2019-03-10 DIAGNOSIS — F429 Obsessive-compulsive disorder, unspecified: Secondary | ICD-10-CM

## 2019-03-10 DIAGNOSIS — M5416 Radiculopathy, lumbar region: Secondary | ICD-10-CM

## 2019-03-10 DIAGNOSIS — I10 Essential (primary) hypertension: Secondary | ICD-10-CM

## 2019-03-10 DIAGNOSIS — E119 Type 2 diabetes mellitus without complications: Secondary | ICD-10-CM

## 2019-03-10 NOTE — Progress Notes
SPINE [facility] HISTORY AND PHYSICAL      CHIEF COMPLAINT     Chief Complaint   Patient presents with   ??? New Patient     back and neck pain          HISTORY OF PRESENT ILLNESS   Jessica Colon is a 53 y.o. female.  She presents today for further evaluation.  She has a history of C6 corpectomy on 11/18/2015.  She had tingling and numbness in her hands, which she states has persisted and is now worse.  Her neck pain went away following surgery, but the symptoms in her hands continued.  She has not had a recent EMG nerve conduction study.  Tingling radiates down her arms intermittently throughout the day, but is constant in her hands.  She reports she has been diagnosed with moderate carpal tunnel on her right and mild on the left.  She has had quite a bit of neck soreness over the last 6-8 months.  She denies any new injury.  She actually states her main complaint is low back pain.  Occasionally pain radiates down her legs.  Patient feels unsteady on her feet.  Her daughter notices that when patient walks that she leans forward.  Patients reports this is due to pain in her low back.  She has not had any recent physical therapy.      Previous Spine Operations -  Yes - 11/18/2015 C 6 corpectomy Dr. Debroah Loop    NSAIDs/Pain Meds -  Yes   Physical Therapy -  Yes   Injections -  No           PAST MEDICAL HISTORY     Medical History:   Diagnosis Date   ??? Anxiety    ??? Arthritis    ??? 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 Chronic generalized pain    ??? DDD (degenerative disc disease), cervical    ??? Depression     Depression and Anxiety   ??? DM2 (diabetes mellitus, type 2) (HCC)    ??? Generalized headaches    ??? GERD (gastroesophageal reflux disease)    ??? Hip pain    ??? HLD (hyperlipidemia)    ??? HTN (hypertension)    ??? Nerve injury    ??? OCD (obsessive compulsive disorder)    ??? OSA (obstructive sleep apnea)    ??? Panic attacks    ??? Sexually transmitted disease     HX of HPV       PAST SURGICAL HISTORY     Surgical History: Procedure Laterality Date   ??? TONSILLECTOMY  1973   ??? CHOLECYSTECTOMY  1990   ??? TUBAL LIGATION  1991   ??? ANTERIOR CERVICAL 5-7 FUSION N/A 11/18/2015    Performed by Catarina Hartshorn[facility] at [facility]   ??? VERTEBRECTOMY CERVICAL 6 N/A 11/18/2015    Performed by Catarina Hartshorn[facility] at [facility] OR   ??? HX TOTAL ABDOMINAL HYSTERECTOMY      2/2 cervical ca       FAMILY HISTORY   family history includes Arthritis in her mother; Cancer in her maternal grandfather; Diabetes in her maternal grandfather; Heart problem in her brother and father; Hypertension in her mother; Joint Pain in her mother; Lung Disease in her brother.    SOCIAL HISTORY     Social History     Socioeconomic History   ??? Marital status: Single     Spouse name: Not on  file   ??? Number of children: 2   ??? Years of education: Not on file   ??? Highest education level: Not on file   Occupational History   ??? Not on file   Tobacco Use   ??? Smoking status: Current Every Day Smoker     Packs/day: 1.00     Years: 38.00     Pack years: 38.00     Types: Cigarettes   ??? Smokeless tobacco: Never Used   ??? Tobacco comment: pt declined   Substance and Sexual Activity   ??? Alcohol use: No     Alcohol/week: 0.0 standard drinks   ??? Drug use: No   ??? Sexual activity: Not Currently     Partners: Male     Birth control/protection: Surgical     Comment: Hysterectomy   Other Topics Concern   ??? Not on file   Social History Narrative    Lives alone in [place] housing.  No DV.  Feels safe.  Currently not working.  On SSI       ALLERGIES     Allergies   Allergen Reactions   ??? Pcn [Penicillins] ANAPHYLAXIS     Pt reports it'll kill me, rapid heart rate and I won't breathe.   ??? Clindamycin EDEMA   ??? Buspirone HEADACHE   ??? Gabapentin ITCHING   ??? Hydrocodone SEE COMMENTS     Pt states it makes me feel strange, like I'm out of control of myself.   ??? Melatonin HEADACHE   ??? Meloxicam SEE COMMENTS     Went to hospital for SOA and rapid heart beat   ??? Mirtazapine SEE COMMENTS     Medication interaction ??? Niacin SEE COMMENTS     Pt states I get really really hot and I feel strange.   ??? Wellbutrin [Bupropion] ITCHING       MEDICATIONS     Current Outpatient Medications:   ???  acetaminophen (TYLENOL) 325 mg tablet, Take 2 Tabs by mouth every 4 hours as needed., Disp: , Rfl: 0  ???  albuterol sulfate (PROAIR HFA) 90 mcg/actuation aerosol inhaler, Inhale two puffs by mouth into the lungs every 6 hours as needed for Wheezing or Shortness of Breath. Shake well before use., Disp: 25.5 g, Rfl: 0  ???  blood sugar diagnostic test strip, Use one strip as directed before meals and at bedtime., Disp: 300 strip, Rfl: 3  ???  Blood-Glucose Meter kit, Use as directed,, Disp: 1 kit, Rfl: 0  ???  cetirizine (ZYRTEC) 10 mg tablet, Take one tablet by mouth every morning., Disp: 90 tablet, Rfl: 1  ???  cholecalciferol(+) (Vitamin D3) 50,000 units capsule, Take one capsule by mouth every 7 days., Disp: 12 capsule, Rfl: 0  ???  clobetasol (TEMOVATE) 0.05 % topical cream, APPLY CREAM TOPICALLY TO AFFECTED AREA TWICE DAILY, Disp: 30 g, Rfl: 1  ???  clonazePAM (KLONOPIN) 1 mg tablet, Take 1 tablet by mouth three times daily., Disp: , Rfl:   ???  duloxetine DR (CYMBALTA) 60 mg capsule, TAKE 2 CAPSULES BY MOUTH ONCE DAILY, Disp: 60 capsule, Rfl: 0  ???  famotidine (HEARTBURN RELIEF (FAMOTIDINE)) 10 mg tablet, Take 10 mg by mouth twice daily., Disp: , Rfl:   ???  fish oil/E/fatty acid5/herb137 (FLAX, FISH AND BORAGE OIL PO), Take  by mouth., Disp: , Rfl:   ???  fluticasone propionate (FLONASE) 50 mcg/actuation nasal spray, suspension, Apply one spray to each nostril as directed twice daily. Shake bottle gently before using.,  Disp: 16 g, Rfl: 3  ???  hydroCHLOROthiazide (HYDRODIURIL) 25 mg tablet, Take one tablet by mouth every morning., Disp: 90 tablet, Rfl: 1  ???  lancets MISC, Use one each as directed three times daily before meals. Diag: DM2, Disp: 300 each, Rfl: 3  ???  lisinopriL (ZESTRIL) 20 mg tablet, Take 1 tablet by mouth once daily, Disp: 90 tablet, Rfl: 0 ???  Miscellaneous [employer] misc, Premier protein shakes  Use as directed for meal replacement daily #30, 11 refills, Disp: 30 each, Rfl: 11  ???  oxyCODONE (ROXICODONE) 5 mg tablet, Take one tablet by mouth every 4 hours as needed for Pain, Disp: 30 tablet, Rfl: 0  ???  tiZANidine (ZANAFLEX) 4 mg tablet, Take one tablet by mouth three times daily as needed., Disp: 30 tablet, Rfl: 1  ???  TURMERIC PO, Take  by mouth., Disp: , Rfl:     REVIEW OF SYSTEMS   Review of Systems   Musculoskeletal: Positive for back pain.   All other systems reviewed and are negative.      PHYSICAL EXAM     Vitals:    03/10/19 1541   BP: 124/68   BP Source: Arm, Left Upper   Patient Position: Sitting   Pulse: 109   Temp: 36.7 ???C (98 ???F)   SpO2: 100%   Weight: 99.2 kg (218 lb 12.8 oz)   Height: 170.2 cm (67.01)   PainSc: Eight        Pain Score: Eight  Body mass index is 34.26 kg/m???.    Constitutional: Alert, NAD  Psychiatric: Mood and affect appropriate  Eyes: EOMI  Respiratory: Unlabored breathing  Cardiovascular: Regular rate  Skin: No rashes or open wounds appreciated on neck  Musculoskeletal: 5/5 strength throughout bilat UEs (shoulder abduction, elbow flex/ext, wrist flex/ext, grip, hand intrinsics), neck ROM limited secondary to pain  Neurologic: Sensation intact to light touch C5-T1 on today's exam, no hyperreflexia, positive Hoffmann's bilaterally, negative inverted radial reflex      RADIOGRAPHIC EVALUATION     Review of patient's MRI demonstrates continued myelomalacia of the cord at the level of her prior corpectomy.      I also reviewed the patient's CT scan.  She looks like she is pretty stiff through this corpectomy site, most likely through the cage itself, as well as the bone graft within the cage.  I don't see a fusion necessarily around this.  That said, I think that for all intents and purposes this is largely fused.  I don't see any loosening of her screws.  She does have some residual stenosis at the top part of her corpectomy site.  I don't see any severe central stenosis above or below this.  She also has evidence of significant spondylosis in her back.  I don't see any frank instability in her lumbar spine.     No MRI of the lumbar spine.       ASSESSMENT / PLAN     Jessica Colon is a 53 y.o. 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      Natural history and conservative treatment options were discussed with the patient.  I am not sure doing a revision surgery on her neck would necessarily lead to significant improvement in her hands at this point.  Her biggest complaint in terms of pain is her  low back.  I think with this in mind, I would recommend an EMG nerve conduction study.  She notes she has been told she has carpal tunnel syndrome in the past.  We will also get an MRI of her low back to rule out an infection, stenosis, or an occult fracture.  I think that ultimately if she did wish to proceed with any kind of surgery on her neck, it would be a revision posterior cervical decompression and fusion C5-7.  We will plan to see her back after these studies are completed for further discussion.          In the presence of Criss Rosales, MD , I have taken down these notes, Jessica Colon, Scribe. March 10, 2019 3:53 PM

## 2019-03-13 ENCOUNTER — Encounter: Admit: 2019-03-13 | Discharge: 2019-03-13

## 2019-04-06 ENCOUNTER — Encounter: Admit: 2019-04-06 | Discharge: 2019-04-06

## 2019-04-06 NOTE — Telephone Encounter
[id number] left VM for patient to log on to room for her upcoming zoom appointment at 0800 with Dr. Tamala Julian.

## 2019-04-06 NOTE — Telephone Encounter
[id number] left voicemail for patient to log into appointment

## 2019-04-06 NOTE — Telephone Encounter
Patient MRI schedule for 7/16 Patient should reschedule telehealth as she has not completed needed appointment requirements. VM left for patient to call our schedule line to schedule at a more appropriate follow up time. Clinic would like patient scheduled for Telehealth visit at the same time of a New Patient visit slot for clinic coordination. Any new patient time slot would be suitable for a clinic telehealth appointment for this patient as long as her MRI has been completed.

## 2019-04-14 ENCOUNTER — Encounter: Admit: 2019-04-14 | Discharge: 2019-04-14

## 2019-04-17 ENCOUNTER — Encounter: Admit: 2019-04-17 | Discharge: 2019-04-17

## 2019-04-17 NOTE — Telephone Encounter
Patient left vm  Reporting.Marland KitchenMarland KitchenI left you a message before reporting I was not happy with Dr. Mayford Knife attitude I do not want to see Dr. Franki Cabot, I will not schedule my MRI with you guys. I want to be released from his care. Give me a call to let me know I have been released from his care so I can see another surgeon, thank you. Patients voice seems upset.     Patient called back no answer at the time of call. VM left telling patient she is able to schedule at any hospital she would like as she is not obligated to continue care with Dr. Franki Cabot. RN would like to encourage patient to get MRI to R/O infection. Patient given [phone] number to call if she has any questions she would like answered she can be directed to Ellender Hose, or [place].

## 2019-04-19 ENCOUNTER — Encounter: Admit: 2019-04-19 | Discharge: 2019-04-19

## 2019-04-19 DIAGNOSIS — I10 Essential (primary) hypertension: Secondary | ICD-10-CM

## 2019-04-20 MED ORDER — LISINOPRIL 20 MG PO TAB
ORAL_TABLET | Freq: Every day | 0 refills | Status: DC
Start: 2019-04-20 — End: 2019-05-31

## 2019-05-24 ENCOUNTER — Encounter: Admit: 2019-05-24 | Discharge: 2019-05-24

## 2019-05-24 DIAGNOSIS — I10 Essential (primary) hypertension: Secondary | ICD-10-CM

## 2019-05-24 DIAGNOSIS — G8929 Other chronic pain: Secondary | ICD-10-CM

## 2019-05-24 MED ORDER — HYDROCHLOROTHIAZIDE 25 MG PO TAB
25 mg | ORAL_TABLET | Freq: Every morning | ORAL | 0 refills | 28.00000 days | Status: DC
Start: 2019-05-24 — End: 2019-09-11

## 2019-05-24 MED ORDER — OXYCODONE 5 MG PO TAB
5 mg | ORAL_TABLET | ORAL | 0 refills | 6.00000 days | Status: DC | PRN
Start: 2019-05-24 — End: 2019-11-06

## 2019-05-24 NOTE — Telephone Encounter
Pt called in yesterday stating she needs a refill on medication. Pt has upcoming appointment 06/07/19.

## 2019-05-31 ENCOUNTER — Encounter: Admit: 2019-05-31 | Discharge: 2019-05-31

## 2019-05-31 ENCOUNTER — Ambulatory Visit: Admit: 2019-05-31 | Discharge: 2019-06-01

## 2019-05-31 DIAGNOSIS — T148XXA Other injury of unspecified body region, initial encounter: Secondary | ICD-10-CM

## 2019-05-31 DIAGNOSIS — F329 Major depressive disorder, single episode, unspecified: Secondary | ICD-10-CM

## 2019-05-31 DIAGNOSIS — R51 Headache: Secondary | ICD-10-CM

## 2019-05-31 DIAGNOSIS — I10 Essential (primary) hypertension: Secondary | ICD-10-CM

## 2019-05-31 DIAGNOSIS — M25559 Pain in unspecified hip: Secondary | ICD-10-CM

## 2019-05-31 DIAGNOSIS — K219 Gastro-esophageal reflux disease without esophagitis: Secondary | ICD-10-CM

## 2019-05-31 DIAGNOSIS — M5030 Other cervical disc degeneration, unspecified cervical region: Secondary | ICD-10-CM

## 2019-05-31 DIAGNOSIS — F410 Panic disorder [episodic paroxysmal anxiety] without agoraphobia: Secondary | ICD-10-CM

## 2019-05-31 DIAGNOSIS — F429 Obsessive-compulsive disorder, unspecified: Secondary | ICD-10-CM

## 2019-05-31 DIAGNOSIS — R52 Pain, unspecified: Secondary | ICD-10-CM

## 2019-05-31 DIAGNOSIS — E119 Type 2 diabetes mellitus without complications: Secondary | ICD-10-CM

## 2019-05-31 DIAGNOSIS — E785 Hyperlipidemia, unspecified: Secondary | ICD-10-CM

## 2019-05-31 DIAGNOSIS — G4733 Obstructive sleep apnea (adult) (pediatric): Secondary | ICD-10-CM

## 2019-05-31 DIAGNOSIS — F419 Anxiety disorder, unspecified: Secondary | ICD-10-CM

## 2019-05-31 DIAGNOSIS — A64 Unspecified sexually transmitted disease: Secondary | ICD-10-CM

## 2019-05-31 DIAGNOSIS — G5603 Carpal tunnel syndrome, bilateral upper limbs: Principal | ICD-10-CM

## 2019-05-31 DIAGNOSIS — M1990 Unspecified osteoarthritis, unspecified site: Secondary | ICD-10-CM

## 2019-05-31 DIAGNOSIS — C539 Malignant neoplasm of cervix uteri, unspecified: Secondary | ICD-10-CM

## 2019-05-31 MED ORDER — LISINOPRIL 20 MG PO TAB
ORAL_TABLET | Freq: Every day | 0 refills | Status: DC
Start: 2019-05-31 — End: 2019-07-03

## 2019-05-31 NOTE — Procedures
Please refer to scanned EMG/NCS report for study results.  No separate note otherwise placed for procedure.      Patient well tolerated the procedure and was sent home with no complications.  Time out was taken today to confirm patient identity.      Patient will followup with referring provider for further instructions and workup as needed.

## 2019-06-30 ENCOUNTER — Encounter: Admit: 2019-06-30 | Discharge: 2019-06-30

## 2019-07-03 MED ORDER — LISINOPRIL 20 MG PO TAB
20 mg | ORAL_TABLET | Freq: Every day | ORAL | 0 refills | Status: DC
Start: 2019-07-03 — End: 2019-08-11

## 2019-07-04 ENCOUNTER — Encounter: Admit: 2019-07-04 | Discharge: 2019-07-04

## 2019-07-07 ENCOUNTER — Encounter: Admit: 2019-07-07 | Discharge: 2019-07-07

## 2019-07-21 ENCOUNTER — Encounter: Admit: 2019-07-21 | Discharge: 2019-07-21

## 2019-07-24 MED ORDER — ALBUTEROL SULFATE 90 MCG/ACTUATION IN HFAA
2 | RESPIRATORY_TRACT | 0 refills | Status: DC | PRN
Start: 2019-07-24 — End: 2020-02-09

## 2019-08-11 ENCOUNTER — Encounter: Admit: 2019-08-11 | Discharge: 2019-08-11

## 2019-08-11 DIAGNOSIS — I10 Essential (primary) hypertension: Secondary | ICD-10-CM

## 2019-08-11 MED ORDER — LISINOPRIL 20 MG PO TAB
20 mg | ORAL_TABLET | Freq: Every day | ORAL | 0 refills | Status: DC
Start: 2019-08-11 — End: 2019-09-11

## 2019-08-19 ENCOUNTER — Encounter: Admit: 2019-08-19 | Discharge: 2019-08-19

## 2019-08-19 DIAGNOSIS — J3489 Other specified disorders of nose and nasal sinuses: Secondary | ICD-10-CM

## 2019-08-19 DIAGNOSIS — R05 Cough: Secondary | ICD-10-CM

## 2019-08-19 DIAGNOSIS — Z20828 Contact with and (suspected) exposure to other viral communicable diseases: Secondary | ICD-10-CM

## 2019-08-19 NOTE — Patient Instructions
Coronavirus Disease 2019 (COVID-19): Overview  Coronavirus disease 2019 (COVID-19) is a respiratory illness. It's caused by a new (novel) coronavirus. There are many types of coronavirus. Coronaviruses are a very common cause of colds and bronchitis. They may sometimes cause lung infection (pneumonia). Symptoms can range from mild to severe. Some people have no symptoms.?These viruses are also found in some animals.   All 50 states in the [place] have reported cases of COVID-19. Most states report community spread of COVID-19. This means the source of the illness is not known.?COVID-19 is a rapidly-emerging infectious disease. This means that scientists are actively researching it.?There are information updates regularly.   Public health officials are working to find the source. How the virus spreads is not yet fully understood, but it seems to spread and infect people fairly easily. Some people who have been infected in an area may not be sure how or where they were infected. The virus may be spread through droplets of fluid that a person coughs or sneezes into the air. It may be spread if you touch a surface with the virus on it, such as a handle or object, and then touch your eyes, nose, or mouth.   For the latest information, visit the CDC website at www.cdc.gov/coronavirus/2019-ncov. Or call 800-CDC-INFO (800-232-4636).     What are the symptoms of COVID-19?  Some people have no symptoms or mild symptoms. Symptoms can also vary from person to person. As experts learn more about COVID-19, other symptoms are being reported. Symptoms may appear 2 to 14 days after contact with the virus:   ? Fever or chills  ? Coughing  ? Trouble breathing or feeling short of breath  ? Sore throat  ? Stuffy or runny nose  ? Headache and body aches  ? Fatigue  ? Nausea, vomiting, diarrhea, or abdominal pain  ? New loss of sense of smell or taste  You can check your symptoms with the CDC?s Coronavirus Self-Checker. What are possible complications from COVID-19?  In many cases, this virus can cause infection (pneumonia) in both lungs. In some cases, this can cause death. Certain people are at higher risk for complications. This includes older adults and people with serious chronic health conditions such as heart or lung disease, diabetes, or kidney disease. It includes people with health conditions that suppress the immune system. And it includes people taking medicines that suppress the immune system.   As experts learn more about COVID-19, other complications are being reported that may be linked to COVID-19. Rarely, some children have developed severe complications called multisystem inflammatory syndrome in children (MIS-C). MIS-C seems to be similar to [place] disease, a rare condition causing inflammation of blood vessels and body organs. It's not yet known if MIS-C happens only in children, or if adults are also at risk. It's also not known if it's related to COVID-19, because many children, but not all, have tested positive for the virus. Experts continue to study MIS-C. The CDC advises healthcare providers to report to local health departments any person under age 21 years old who is ill enough to be in the hospital and has all of the following:   ? A fever over 100.4?F (38.0?C) for more than 24 hours and a positive SARS-CoV-2 test or exposure to the virus in the last 4 weeks   ? Inflammation in at least 2 organs such as the heart, lungs, or kidneys with lab tests that show inflammation   ? No other diagnoses besides COVID-19   explain the child's symptoms     How is COVID-19 diagnosed? Your healthcare provider will ask about your symptoms. He or she will ask where you live, and about your recent travel, and any contact with sick people. If your healthcare provider thinks you may have COVID-19, he or she will consider whether to test you for COVID-19. This depends on the availability of testing in your area, and how sick you are. Follow all instructions from your healthcare provider. Guidelines for testing may change as more information about the virus becomes available. Currently, COVID-19 is diagnosed by:   ? Viral test.  Viral tests tell if you have a current COVID-19 infection. A nose-throat swab may be wiped inside your nose to the back of your throat. Or a sample of your saliva may be taken. Either of these samples will be checked for the SARS-CoV-2 virus. Availability of tests vary. Some test kits can be done at home. But both nose-throat swabs and saliva tests must be sent to a lab to be looked at.   If your healthcare provider thinks or confirms that you have COVID-19, you may have other tests. These tests may include:   ? Antibody blood test.  Antibody tests are being looked at to find out if a person has previously been infected with the virus and may now have antibodies such as SARS AB IgG in their blood to give some immunity. The accuracy and availability of antibody tests vary. An antibody test may not be able to show if you have a current infection because it can take up to a few weeks after infection to make antibodies. It's not yet known how long immunity lasts after being infected with the virus.   ? Sputum culture.  A small sample of mucus coughed from your lungs (sputum) may be collected if you have a moist cough. It may be checked for the virus or to look for pneumonia.   ? Imaging tests.  You may have a chest X-ray or CT scan.       Note about reinfection and your immunity At this time, it's unclear if people can be reinfected with COVID-19. The CDC notes that if a person has fully recovered from COVID-19 and is retested within 3 months of the first infection, they may continue to have low levels of the virus in their body and test positive for COVID-19, even though they are not spreading COVID-19. Having a positive COVID-19 test after an infection doesn't mean you can't be reinfected. It's not yet known how long immunity lasts after being infected with the virus.   How is COVID-19 treated?  There is currently no medicine proven to prevent or treat the virus. Some experimental medicines are being tested for COVID-19. Other medicines used to treat other conditions are being looked at for COVID-19, but these are not currently approved to treat it.   The most proven treatments right now are those to help your body while it fights the virus. This is known as supportive care. Supportive care may include:   ? Getting rest.  This helps your body fight the illness.   ? Staying hydrated.  Drinking liquids is the best way to prevent dehydration.. Try to drink 6 to 8 glasses of liquids every day, or as advised by your provider. Also check with your provider about which fluids are best for you. Don't drink fluids that contain caffeine or alcohol.   ? Taking over-the-counter (OTC)?pain medicine.  These are used  to help ease pain and reduce fever. Follow your healthcare provider's instructions for which OTC medicine to use.   For severe illness, you may need to stay in the hospital. Care during severe illness may include:   ? IV (intravenous) fluids.  These are given through a vein to help keep your body hydrated.   ? Oxygen. You may be given supplemental oxygen or ventilation with a breathing machine (ventilator). This is done so you get enough oxygen in your body. ? Prone positioning.  Depending on how sick you are during your hospital stay, your healthcare team may turn you regularly on your stomach. This is called prone positioning. It helps increase the amount of oxygen you get to your lungs. Follow your healthcare team's instructions on position changes while you're in the hospital. Also follow their discharge advice on the best positions to help your breathing once you go home.   People who have had COVID-19 and are fully recovered may be asked by their healthcare team to consider donating plasma. This is called COVID-19 convalescent plasma donation. Plasma from people fully recovered from COVID-19 may contain antibodies to help fight COVID-19 in people who are currently seriously ill with the disease. Experts don't know if the donated plasma will work well as a treatment. Research continues, and the FDA has approved it for emergency use in certain people with serious or life-threatening COVID-19. Talk with your provider to learn more about convalescent plasma donation and whether you qualify to donate.       Are you at risk for COVID-19?  You are at risk for COVID-19 if you have had close contact with someone with the virus, or if you live in or traveled to an area with cases of it. Close contact means being within about 6 feet of someone, or living in the same house or visiting a person who has or may have COVID-19. Some recent studies suggest that COVID-19 may be spread by people who are not showing symptoms.     Preventing the Spread of Infection    ?     Hand Hygiene    The best way to prevent the spread of infections is to wash your hands or use hand sanitizer. Staff will clean hands between tasks and upon entering and exiting your hospital room. ?   For patients and visitors:    Clean hands frequently, upon entering and exiting a room, and after coughing and sneezing.    When washing hands with soap and water:   ? Wet hands with warm water  ? Apply soap ? Lather soap by rubbing hands together for 20 seconds, covering all surfaces of hands and fingers  ? Rinse hands thoroughly  ? Dry hands with paper towel  ? Use a towel to turn off faucet  ?  When cleaning hands with hand sanitizer:   ? Apply hand sanitizer to hands  ? Rub on hands covering of hands and fingers until dry (about 15 to 20 seconds) ?      Coronavirus Disease 2019 (COVID-19): Caring for Yourself or Others   If you or a household member have symptoms of COVID-19, follow the guidelines below for preventing spread of the virus, and managing symptoms.     If you think you have COVID-19 symptoms  ? Stay home. Call your healthcare provider and tell them you have symptoms of COVID-19. Do this before going to any hospital or clinic. Follow your provider's instructions. You may be advised to  isolate yourself at home. This is called self-isolation.   ? Don?t panic. Keep in mind that other illnesses can cause similar symptoms.   ? Stay away from work, school, and public places. Limit physical contact with family members. Limit visitors. Don't kiss anyone or share eating or drinking utensils. Clean surfaces you touch with disinfectant. This is to help prevent the virus from spreading.   ? If you need to cough or sneeze, do it into a tissue. Then throw the tissue into the trash. If you don't have tissues, cough or sneeze into the bend of your elbow.   ? Don?t share food or personal items with people in your household. This includes items like eating and drinking utensils, towels, and bedding.   ? Wear a cloth face mask around other people. During a public health emergency, medical face masks may be reserved for healthcare workers. You may need to make a cloth face mask of your own. You can do this using a bandana, T-shirt, or other cloth. The CDC has instructions on how to make a face mask. Wear the mask so that it covers both your nose and mouth. ? If you need to go to a hospital or clinic, expect that the healthcare staff will wear protective equipment such as masks, gowns, gloves, and eye protection. You may be put in a separate room. This is to prevent the possible virus from spreading.   ? Tell the healthcare staff about recent travel. This includes local travel on public transport. Staff may need to find other people you have been in contact with.   ? Follow all instructions the healthcare staff give you.    If you have been diagnosed with COVID-19  ? Stay home and start self-isolation. Don?t leave your home unless you need to get medical care. Don't go to work, school, or public areas. Don't use public transportation or taxis.   ? Follow all instructions from your healthcare provider. Call your healthcare provider?s office before going. They can prepare and give you instructions. This will help prevent the virus from spreading.   ? If you need to go to a hospital or clinic, expect that the healthcare staff will wear protective equipment such as masks, gowns, gloves, and eye protection. You may be put in a separate room. This is to prevent the possible virus from spreading.   ? Wear a face mask. This is to protect other people from your germs. If you are not able to wear a mask, your caregivers should. During a public health emergency, medical face masks may be reserved for healthcare workers. You may need to make a cloth face mask of your own. You can do this using a bandana, T-shirt, or other cloth. The CDC has instructions on how to make a face mask. Wear the mask so that it covers both your nose and mouth.   ? Stay away from other people in your home.  ? Have no contact with pets and animals.  ? Don?t share food or personal items with people in your household. This includes items like eating and drinking utensils, towels, and bedding. ? If you need to cough or sneeze, do it into a tissue. Then throw the tissue into the trash. If you don't have tissues, cough or sneeze into the bend of your elbow.   ? Wash your hands often.    Self-care at home?  There is currently no medicine approved to prevent or treat the virus. Some experimental and  other medicines are being tested against COVID-19. Other medicines used to treat other conditions are being looked at for COVID-19, but they are not currently approved to treat it.   Current treatment is mainly aimed at helping your body while it fights the virus. This is known as supportive care. Take care of yourself at home by:   ? Getting rest. This helps your body fight the illness.   ? Staying hydrated.  Drinking liquids is the best way to prevent dehydration. Try to drink 6 to 8 glasses of liquids every day, or as advised by your provider. Also check with your provider about which fluids are best for you. Don't drink fluids that contain caffeine or alcohol.   ? Taking over-the-counter (OTC) pain medicine. These are used to help ease pain and reduce fever. Follow your healthcare provider's instructions for which OTC medicine to use.   If you've been in the hospital for suspected or confirmed COVID-19 and now are home, follow all of your healthcare team's instructions. This will include when it's OK to stop self-isolation. You may also get instructions on position changes to help your breathing, such as lying on your belly (prone positioning). If you've had confirmed COVID-19, your healthcare team may ask you to consider donating your plasma. This is called COVID-19 convalescent plasma donation. Plasma from people fully recovered from COVID-19 may contain antibodies to help fight COVID-19 in people who are currently seriously ill with the disease. Experts don't know the safety of COVID-19 convalescent plasma or how well it works. Research continues. The FDA has approved it for emergency use in certain people with serious or life-threatening COVID-19.     Caring for a sick person?  ? Follow all instructions from healthcare staff.  ? Wash your hands often.  ? Wear protective clothing as advised.  ? Make sure the sick person wears a mask. If they can't wear a mask, don't stay in the same room with the person. If you must be in the same room, wear a face mask. When wearing a mask, make sure that it covers both the nose and mouth.   ? Keep track of the sick person?s symptoms.  ? Clean home surfaces often with disinfectant. This includes phones, kitchen counters, fridge door handle, bathroom surfaces, and others.   ? Don?t let anyone share household items with the sick person. This includes eating and drinking tools, towels, sheets, or blankets.   ? Clean fabrics and laundry thoroughly.  ? Keep other people and pets away from the sick person.    When you can stop self-isolation  When you are sick with COVID-19, you should stay away from other people. This is called self-isolation. Your limits are different if you've had COVID-19 in the last 3 months but are fully recovered without symptoms and you have been exposed to someone with COVID-19. If you are symptom-free, you don't need to stay home away from others or be retested. The CDC doesn't recommend retesting unless you have symptoms of COVID-19 and your new symptoms can't be linked to another illness. Contact your healthcare provider if you have any questions. If you develop symptoms, stay home. If you had COVID-19 over 3 months ago and have been exposed again, treat it like you've never had COVID-19 and stay home, limit your contact with others, call your provider, and monitor for symptoms.   If you are normally healthy, the CDC does not advise retesting for COVID-19 with nose-throat swabs. You can stop self-isolation  when all 3 of these are true:   1. You have had no fever for at least 24 hours. This means no fever without medicine that reduces fever, such as acetaminophen, for at least 24 hours.   2. Your symptoms such as cough or trouble breathing have improved.   3. It has been at least 10 days since your first symptoms started.   Talk with your healthcare provider before you leave home. Tell them if the 3 things above are true for you. They may tell you it?s OK to leave home. In some cases, your state or local area may have specific advice. Your healthcare provider will tell you more.?   If you have a weak immune system and COVID-19, or if you've had severe COVID-19,  your instructions on when to stop isolation will be somewhat different. Some conditions and treatments can cause a weak immune system. These include cancer treatment, bone marrow or organ transplants, and conditions such as HIV or other immune system disorders. You may be advised to stay home from 10 days to 20 days after your symptoms first started. Your healthcare provider may want to retest you for COVID-19. Follow your provider's instructions. When you return to public settings  When you are well enough to go outside your home, consider the CDC's guidance on cloth face masks:   ? The CDC advises all people over age 30 to wear cloth face masks in public settings when around people outside of their household, especially when it's hard to socially distance. For example, wear a face mask in populated places such as public transit, public protests and marches, and crowded stores, bars, and restaurants.   ? Cloth masks may help prevent people who have COVID-19 form spreading the virus to others.   ? Cloth masks are most likely to reduce COVID-19 spread when masks are widely used by people who are out in the public.   Certain people should not wear a face covering. This includes:   ? Children younger than 80 years old  ? Anyone with a health, developmental, or mental health condition that can be made worse by wearing a mask   ? Anyone who is unconscious or unable to remove the face covering without help. See the CDC's guidance on who should not wear a face mask.     When to call your healthcare provider  Call your healthcare provider right away if a sick person has any of these:   ? Trouble breathing  ? Pain or pressure in chest  If a sick person has any of these, call 911:  ? Trouble breathing that gets worse  ? Pain or pressure in chest that gets worse  ? Blue tint to lips or face  ? Fast or irregular heartbeat  ? Confusion or trouble waking  ? Fainting or loss of consciousness  ? Coughing up blood    Going home from the hospital   If you were diagnosed with COVID-19 and were recently discharged from the hospital:   ? Follow the instructions above for self-care and isolation.  ? Follow the hospital healthcare team?s specific instructions.   ? Ask questions if anything is unclear to you. Write down answers so you remember them.

## 2019-09-07 ENCOUNTER — Encounter: Admit: 2019-09-07 | Discharge: 2019-09-07

## 2019-09-07 DIAGNOSIS — E119 Type 2 diabetes mellitus without complications: Secondary | ICD-10-CM

## 2019-09-11 ENCOUNTER — Encounter: Admit: 2019-09-11 | Discharge: 2019-09-11

## 2019-09-11 DIAGNOSIS — I10 Essential (primary) hypertension: Secondary | ICD-10-CM

## 2019-09-11 MED ORDER — LISINOPRIL 20 MG PO TAB
ORAL_TABLET | Freq: Every day | 0 refills | Status: DC
Start: 2019-09-11 — End: 2019-10-17

## 2019-09-11 MED ORDER — HYDROCHLOROTHIAZIDE 25 MG PO TAB
ORAL_TABLET | Freq: Every day | ORAL | 0 refills | 28.00000 days | Status: DC
Start: 2019-09-11 — End: 2019-10-17

## 2019-09-11 NOTE — Telephone Encounter
Patient advised to make an appointment

## 2019-10-16 ENCOUNTER — Encounter: Admit: 2019-10-16 | Discharge: 2019-10-16

## 2019-10-16 DIAGNOSIS — I10 Essential (primary) hypertension: Secondary | ICD-10-CM

## 2019-10-17 MED ORDER — LISINOPRIL 20 MG PO TAB
ORAL_TABLET | Freq: Every day | 0 refills | Status: DC
Start: 2019-10-17 — End: 2019-11-06

## 2019-10-17 MED ORDER — HYDROCHLOROTHIAZIDE 25 MG PO TAB
ORAL_TABLET | Freq: Every day | ORAL | 0 refills | 28.00000 days | Status: DC
Start: 2019-10-17 — End: 2019-11-06

## 2019-10-21 ENCOUNTER — Encounter: Admit: 2019-10-21 | Discharge: 2019-10-21

## 2019-10-22 NOTE — Telephone Encounter
Returned after hours clinic call.    Patient reports for the last 3-4 days she has been extremely sleepy, having night sweats, and feeling pain shooting to her left arm. She has a cough and has also been dizzy. Patient denies any chest pain, but reports she feels "really weird" like something is seriously wrong.     Patient recently went to the ED on 1/19 for right shoulder pain in [place] and was put on prednisone and diclofenac, however she stopped taking those medications because she felt so weird.     Recommended patient be evaluated at the nearest ED to rule out MI. Patient reports that her mother will be able to drive her to the hospital.     Ronn Melena, [facility] [place]  Department of [facility], PGY-1

## 2019-11-06 ENCOUNTER — Ambulatory Visit: Admit: 2019-11-06 | Discharge: 2019-11-07 | Payer: Medicaid Other

## 2019-11-06 DIAGNOSIS — G5601 Carpal tunnel syndrome, right upper limb: Secondary | ICD-10-CM

## 2019-11-06 DIAGNOSIS — Z1231 Encounter for screening mammogram for malignant neoplasm of breast: Secondary | ICD-10-CM

## 2019-11-06 DIAGNOSIS — G8929 Other chronic pain: Secondary | ICD-10-CM

## 2019-11-06 DIAGNOSIS — I10 Essential (primary) hypertension: Secondary | ICD-10-CM

## 2019-11-06 DIAGNOSIS — J0100 Acute maxillary sinusitis, unspecified: Secondary | ICD-10-CM

## 2019-11-06 DIAGNOSIS — H359 Unspecified retinal disorder: Secondary | ICD-10-CM

## 2019-11-06 MED ORDER — LISINOPRIL 20 MG PO TAB
20 mg | ORAL_TABLET | Freq: Every day | ORAL | 0 refills | Status: DC
Start: 2019-11-06 — End: 2019-12-29

## 2019-11-06 MED ORDER — OXYCODONE 5 MG PO TAB
5 mg | ORAL_TABLET | ORAL | 0 refills | 6.00000 days | Status: AC | PRN
Start: 2019-11-06 — End: ?

## 2019-11-06 MED ORDER — DOXYCYCLINE HYCLATE 100 MG PO TAB
100 mg | ORAL_TABLET | Freq: Two times a day (BID) | ORAL | 0 refills | 8.00000 days | Status: DC
Start: 2019-11-06 — End: 2020-02-13

## 2019-11-06 MED ORDER — HYDROCHLOROTHIAZIDE 25 MG PO TAB
25 mg | ORAL_TABLET | Freq: Every morning | ORAL | 0 refills | 28.00000 days | Status: DC
Start: 2019-11-06 — End: 2019-12-29

## 2019-11-06 NOTE — Progress Notes
Called patient to get information for telehealth appointment.  Vitals are patient reported. (please see visit encounter for 11/06/2019).  Patient states that she did receive financial policy, consent to treat and notice of privacy and does verbally consent to all three.

## 2019-11-06 NOTE — Patient Instructions
[place]  No reviews  Podiatrist  [address]  [address]  [phone]    [facility] Audie Clear DPM  No reviews  Podiatrist  [address]  [address]  [phone]

## 2019-11-06 NOTE — Progress Notes
Date of Service: 11/06/2019    Jessica Colon is a 54 y.o. female.    Subjective:             History of Present Illness    Chief Complaint   Patient presents with   ? Congestion     phlegm, left side of nose, x 2 weeks, has been taking tylenol OTC    ? Cough   ? Medication Refill   Obtained patient's, or patient proxy's, verbal consent to treat them and their agreement to [facility] financial policy and NPP via this telehealth visit during the [facility] Emergency  VIDEO    1. Cough  -recently moved to [place], [place]  -has increased drainage and sinus pain/pressure  -+green mucus drainage from nose  -no chest tightness/wheezing  -no fever/chills  -using tylenol sinus    2. DM2  -has not been taking meds  -has cut back on soda  -drinking more water    3. Retinal disorder  -seen by optometrist  -advised that she has a retinal disorder was advised to see retina specialist    4. CTS  -had EMG 05/2019, pt dx'd with mod-severe carpal tunnel syndrome             Review of Systems   Constitutional: Negative for chills, fatigue and fever.   HENT: Positive for congestion, sinus pressure and sinus pain. Negative for sore throat and trouble swallowing.    Respiratory: Positive for cough. Negative for chest tightness, shortness of breath and wheezing.    Cardiovascular: Negative for chest pain and leg swelling.   Musculoskeletal: Positive for myalgias.   Neurological: Positive for headaches. Negative for dizziness and light-headedness.         Objective:         ? acetaminophen (TYLENOL) 325 mg tablet Take 2 Tabs by mouth every 4 hours as needed.   ? albuterol sulfate (PROAIR HFA) 90 mcg/actuation HFA aerosol inhaler Inhale two puffs by mouth into the lungs every 6 hours as needed for Wheezing or Shortness of Breath.   ? blood sugar diagnostic test strip Use one strip as directed before meals and at bedtime.   ? Blood-Glucose Meter kit Use as directed, ? cholecalciferol(+) (Vitamin D3) 50,000 units capsule Take one capsule by mouth every 7 days.   ? clonazePAM (KLONOPIN) 1 mg tablet Take 1 tablet by mouth three times daily.   ? duloxetine DR (CYMBALTA) 60 mg capsule TAKE 2 CAPSULES BY MOUTH ONCE DAILY   ? famotidine (HEARTBURN RELIEF (FAMOTIDINE)) 10 mg tablet Take 10 mg by mouth twice daily.   ? fish oil/E/fatty acid5/herb137 (FLAX, FISH AND BORAGE OIL PO) Take  by mouth.   ? fluticasone propionate (FLONASE) 50 mcg/actuation nasal spray, suspension Apply one spray to each nostril as directed twice daily. Shake bottle gently before using.   ? hydroCHLOROthiazide (HYDRODIURIL) 25 mg tablet TAKE 1 TABLET BY MOUTH ONCE DAILY IN THE MORNING   ? lancets MISC Use one each as directed three times daily before meals. Diag: DM2   ? lisinopriL (ZESTRIL) 20 mg tablet Take 1 tablet by mouth once daily   ? oxyCODONE (ROXICODONE) 5 mg tablet Take one tablet by mouth every 4 hours as needed for Pain     There were no vitals filed for this visit.  There is no height or weight on file to calculate BMI.     Physical Exam  CONSTITUTIONAL: Conversant, well developed, in NAD.  EYES: Anicteric sclerae;  no lid-lag or proptosis.  RESPIRATORY: Normal respiratory effort.  NEURO: Cranial nerves II?XII grossly intact.  PSYCH: Intact judgment and insight. A&OX3 with a cordial affect.                Assessment and Plan:  Jessica Colon was seen today for congestion, cough and medication refill.    Diagnoses and all orders for this visit:    Acute non-recurrent maxillary sinusitis  -tx for acute sinusitis  -cont symptomatic management with flonase/nasal saline rinse  -return precautions given  -     doxycycline (VIBRAMYCIN) 100 mg tablet; Take one tablet by mouth twice daily.    Encounter for screening mammogram for malignant neoplasm of breast  -     MAMMO SCREEN BILAT/TOMO/CAD; Future; Expected date: 11/06/2019    Essential hypertension  -refill meds today -pt advised to come for in person appt for BP check and med adjustment  -     hydroCHLOROthiazide (HYDRODIURIL) 25 mg tablet; Take one tablet by mouth every morning.  -     lisinopriL (ZESTRIL) 20 mg tablet; Take one tablet by mouth daily.    Other chronic pain  -pt uses med sparingly  -refill med  -     oxyCODONE (ROXICODONE) 5 mg tablet; Take one tablet by mouth every 4 hours as needed for Pain    Retina disorder  -seen by optometrist 03/2019. Pt was told that she has an unknown lesion in her L eye periphery.  Was advised to see retina specialist  -referral placed today  -     AMB REFERRAL TO OPHTHALMOLOGY    Carpal tunnel syndrome of right wrist  -recent EMG showing mod-severe CTS  -no improvement in sx's after wearing wrist brace  -refer to ortho for additional tx recs  -     AMB REFERRAL TO ORTHOPEDIC SURGERY/ SPORTS MEDICINE

## 2019-11-28 IMAGING — CR UP_EXM
2 series · 2 of 2 positions shown · non-contrast
Comparison: No relevant prior studies available.
COMPARISON: No relevant prior studies available.

11/28/19

DIAGNOSTIC STUDIES
EXAM:  XR LEFT HAND COMPLETE, 3 OR MORE VIEWS  (55880)
INDICATION: left hand pain, s/p fall Pt c/o left hand pain. Fall. CF ATH

[wrist pa]
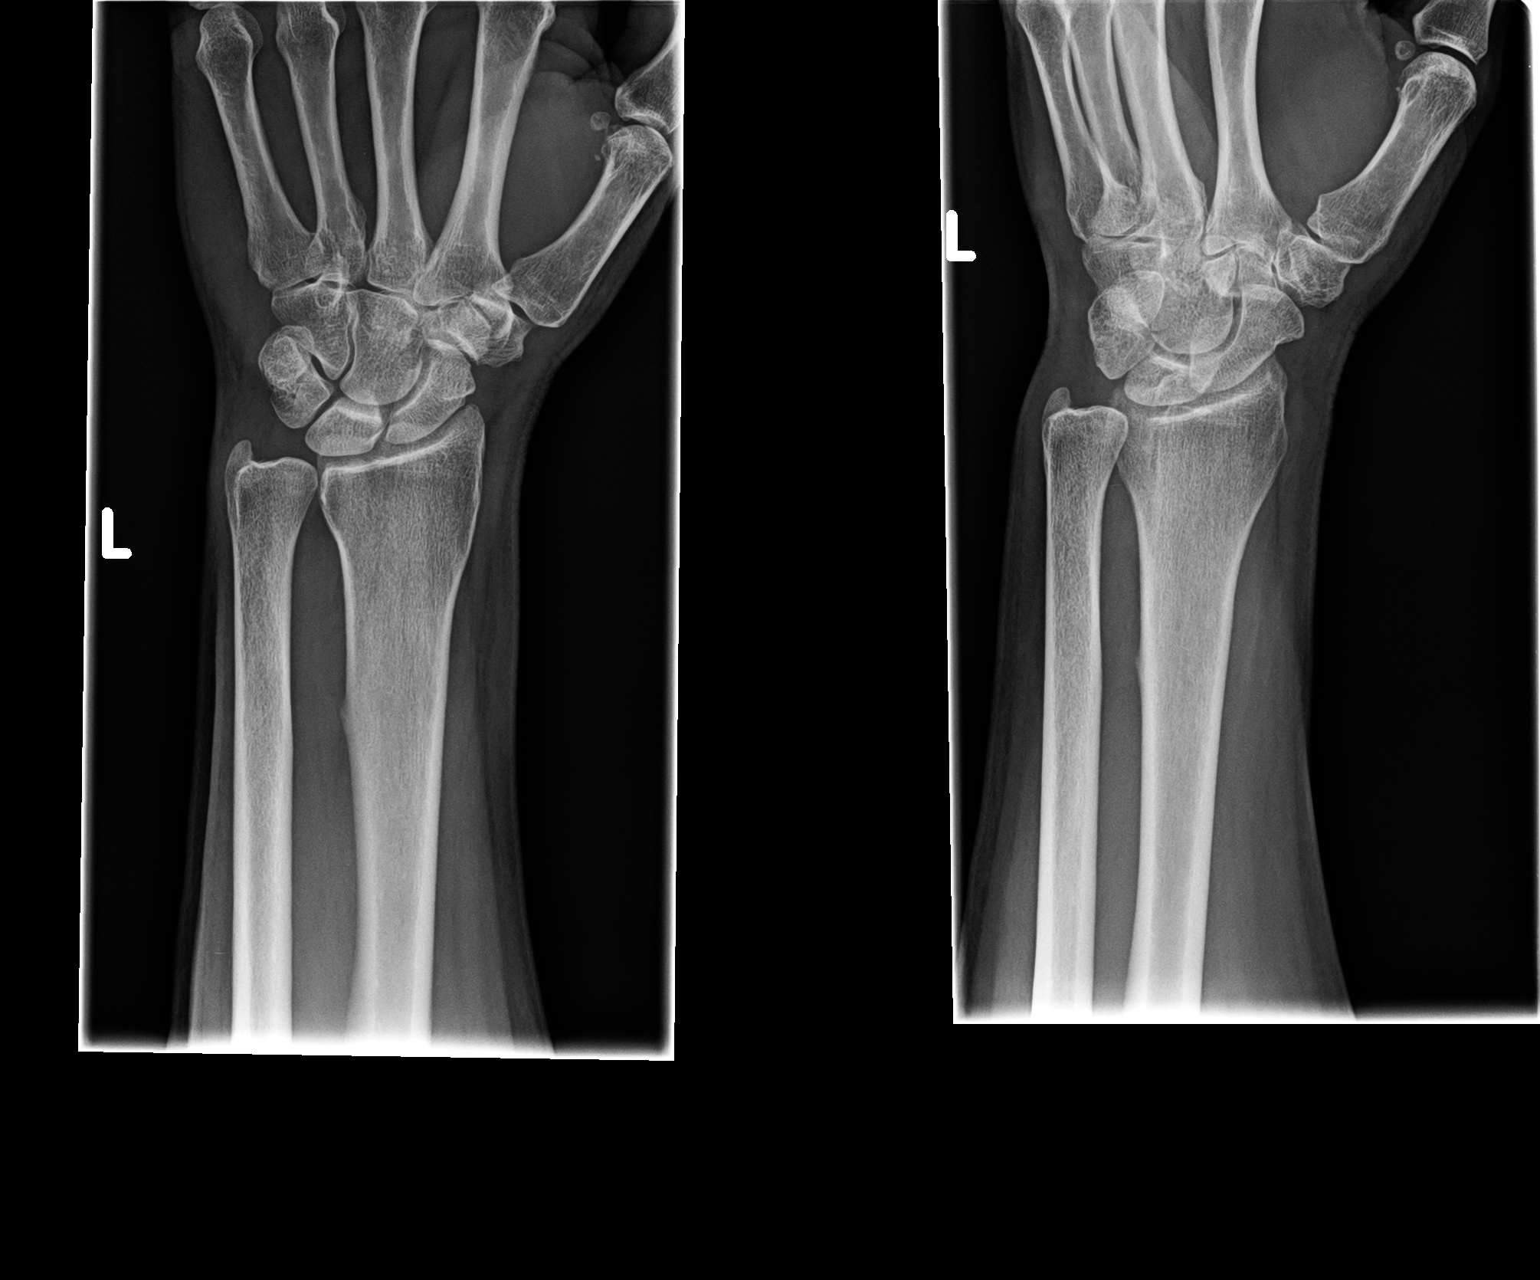

[wrist lat]
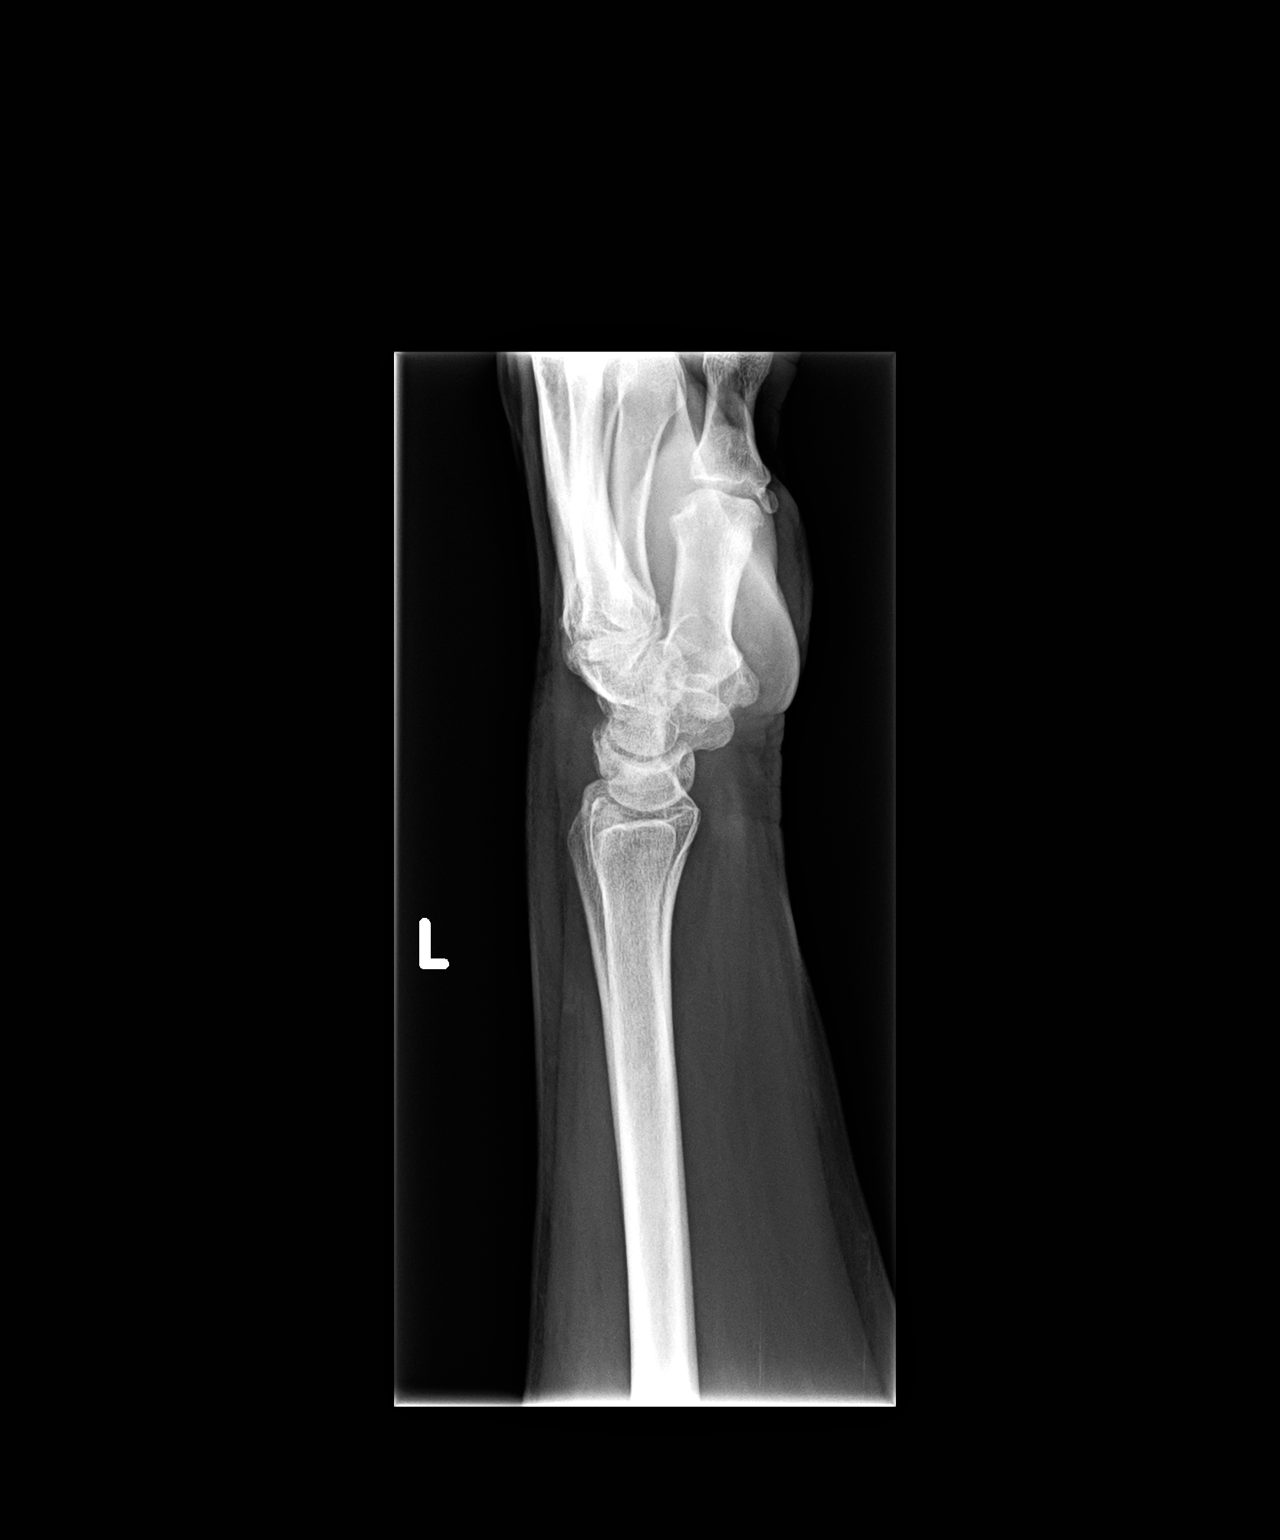

[2 of 2 positions shown; findings below may reference images not displayed]

FINDINGS: BONES/JOINTS:  NO definite fracture or dislocation.

SOFT TISSUES:  No radiopaque foreign body.
IMPRESSION: - There is NO evidence of acute fractures or dislocations.

_______________________________________________

DIAGNOSTIC STUDIES

EXAM:  XR LEFT WRIST COMPLETE, 3 OR MORE VIEWS  ([id number])
FINDINGS: BONES/JOINTS:  NO definite fracture or dislocation.

SOFT TISSUES:  No radiopaque foreign body.
IMPRESSION: - There is NO evidence of acute fractures or dislocations.

Tech Notes:

Pt c/o left wrist pain. Fall. CF

## 2019-11-28 IMAGING — CR UP_EXM
2 series · 2 of 2 positions shown · non-contrast
Comparison: No relevant prior studies available.
COMPARISON: No relevant prior studies available.

11/28/19

DIAGNOSTIC STUDIES
EXAM:  XR LEFT HAND COMPLETE, 3 OR MORE VIEWS  ([id number])
INDICATION: left hand pain, s/p fall Pt c/o left hand pain. Fall. CF ATH

[hand]
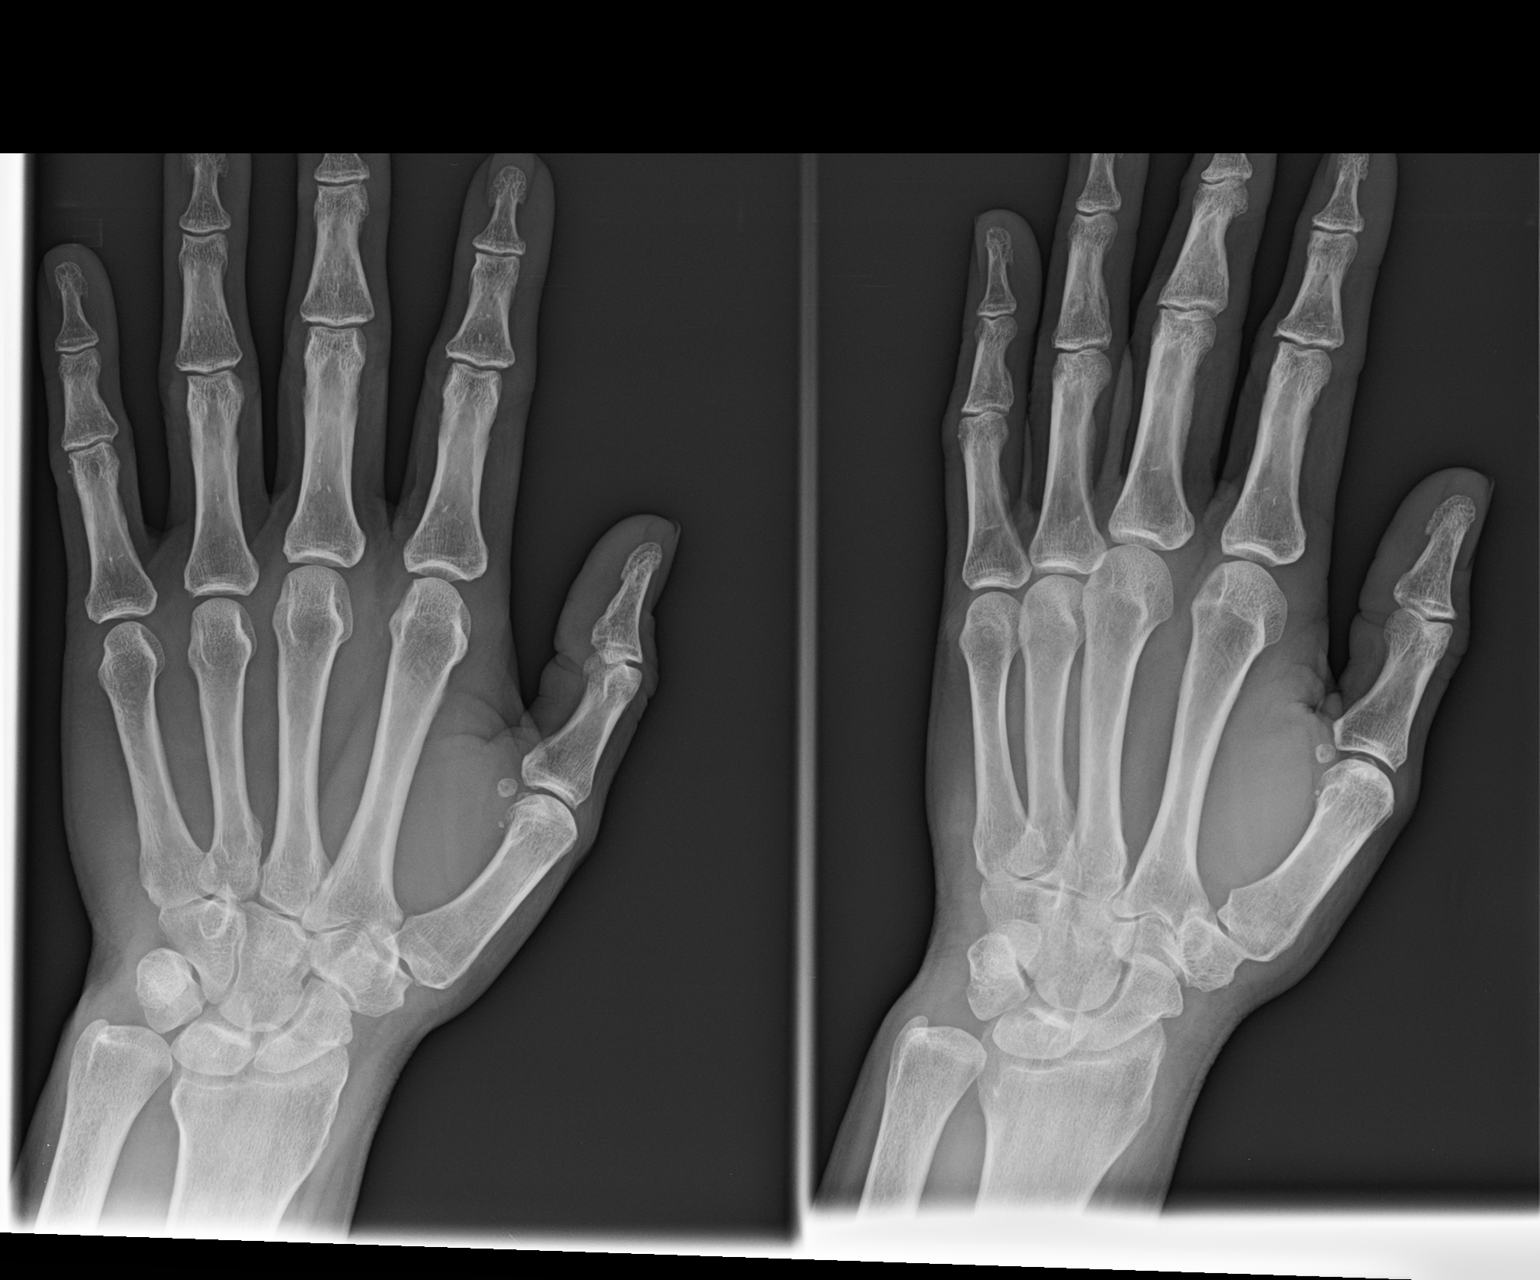

[hand lat]
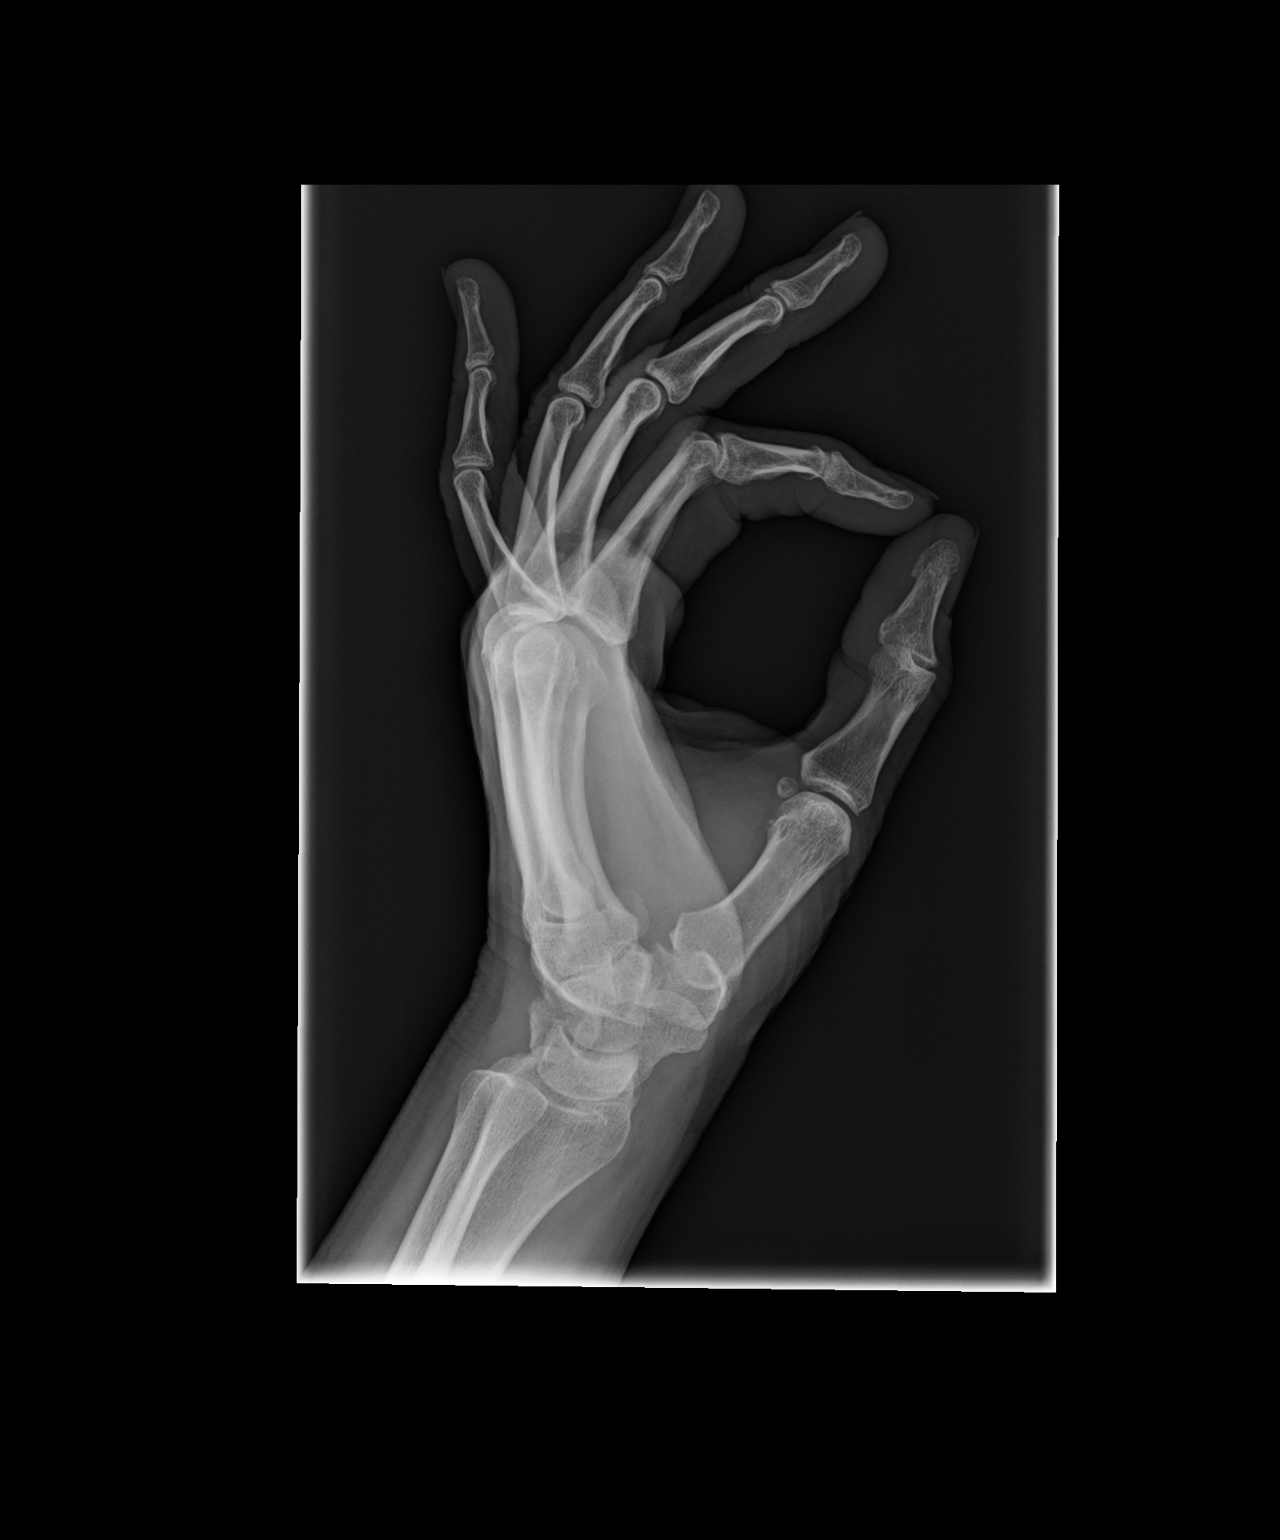

[2 of 2 positions shown; findings below may reference images not displayed]

FINDINGS: BONES/JOINTS:  NO definite fracture or dislocation.

SOFT TISSUES:  No radiopaque foreign body.
IMPRESSION: - There is NO evidence of acute fractures or dislocations.

_______________________________________________

DIAGNOSTIC STUDIES

EXAM:  XR LEFT WRIST COMPLETE, 3 OR MORE VIEWS  ([id number])
FINDINGS: BONES/JOINTS:  NO definite fracture or dislocation.

SOFT TISSUES:  No radiopaque foreign body.
IMPRESSION: - There is NO evidence of acute fractures or dislocations.

Tech Notes:

Pt c/o left hand pain. Fall. CF

## 2019-11-28 IMAGING — CR LOW_EXM
3 series · 3 of 3 positions shown · non-contrast
Comparison: No relevant prior studies available.

11/28/19

DIAGNOSTIC STUDIES
EXAM:  XR RIGHT KNEE, 3 VIEWS  ([id number])
INDICATION: right knee pain, s/p fall Pt c/o right knee pain. Fall. CF ATH

[knee ap]
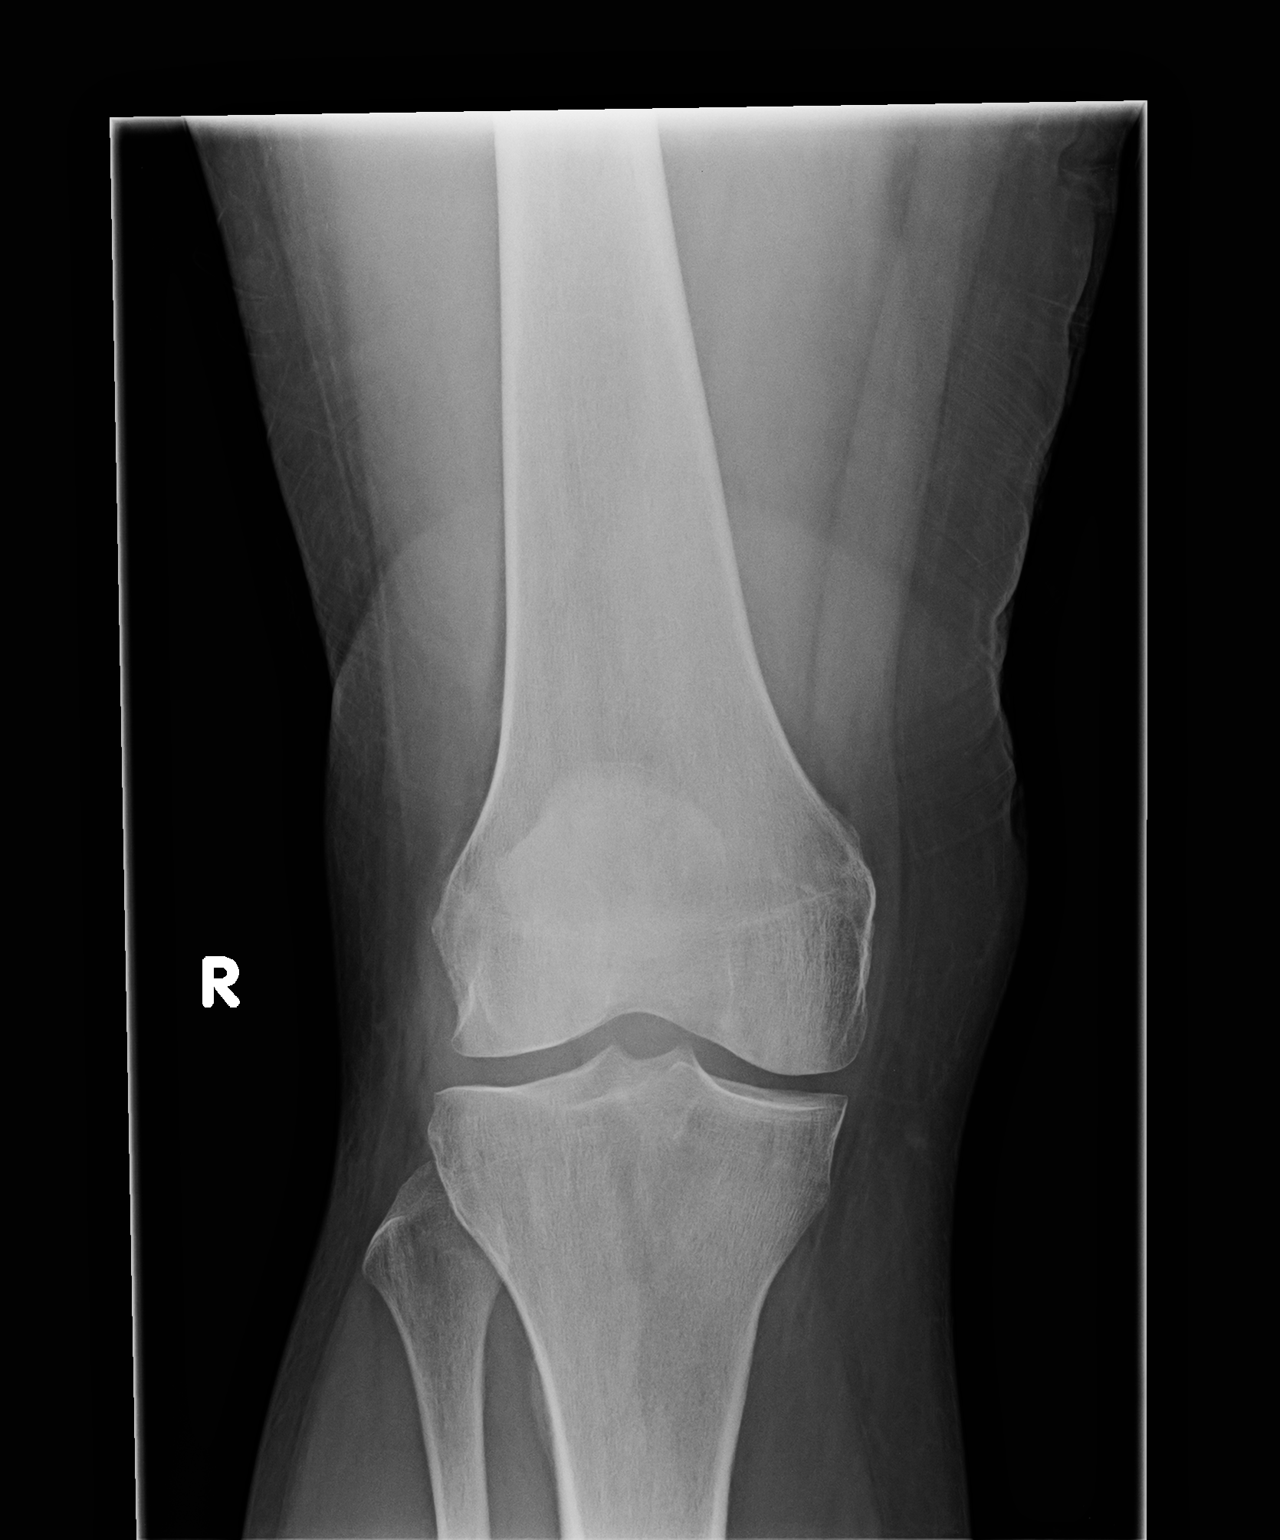

[knee sunrise]
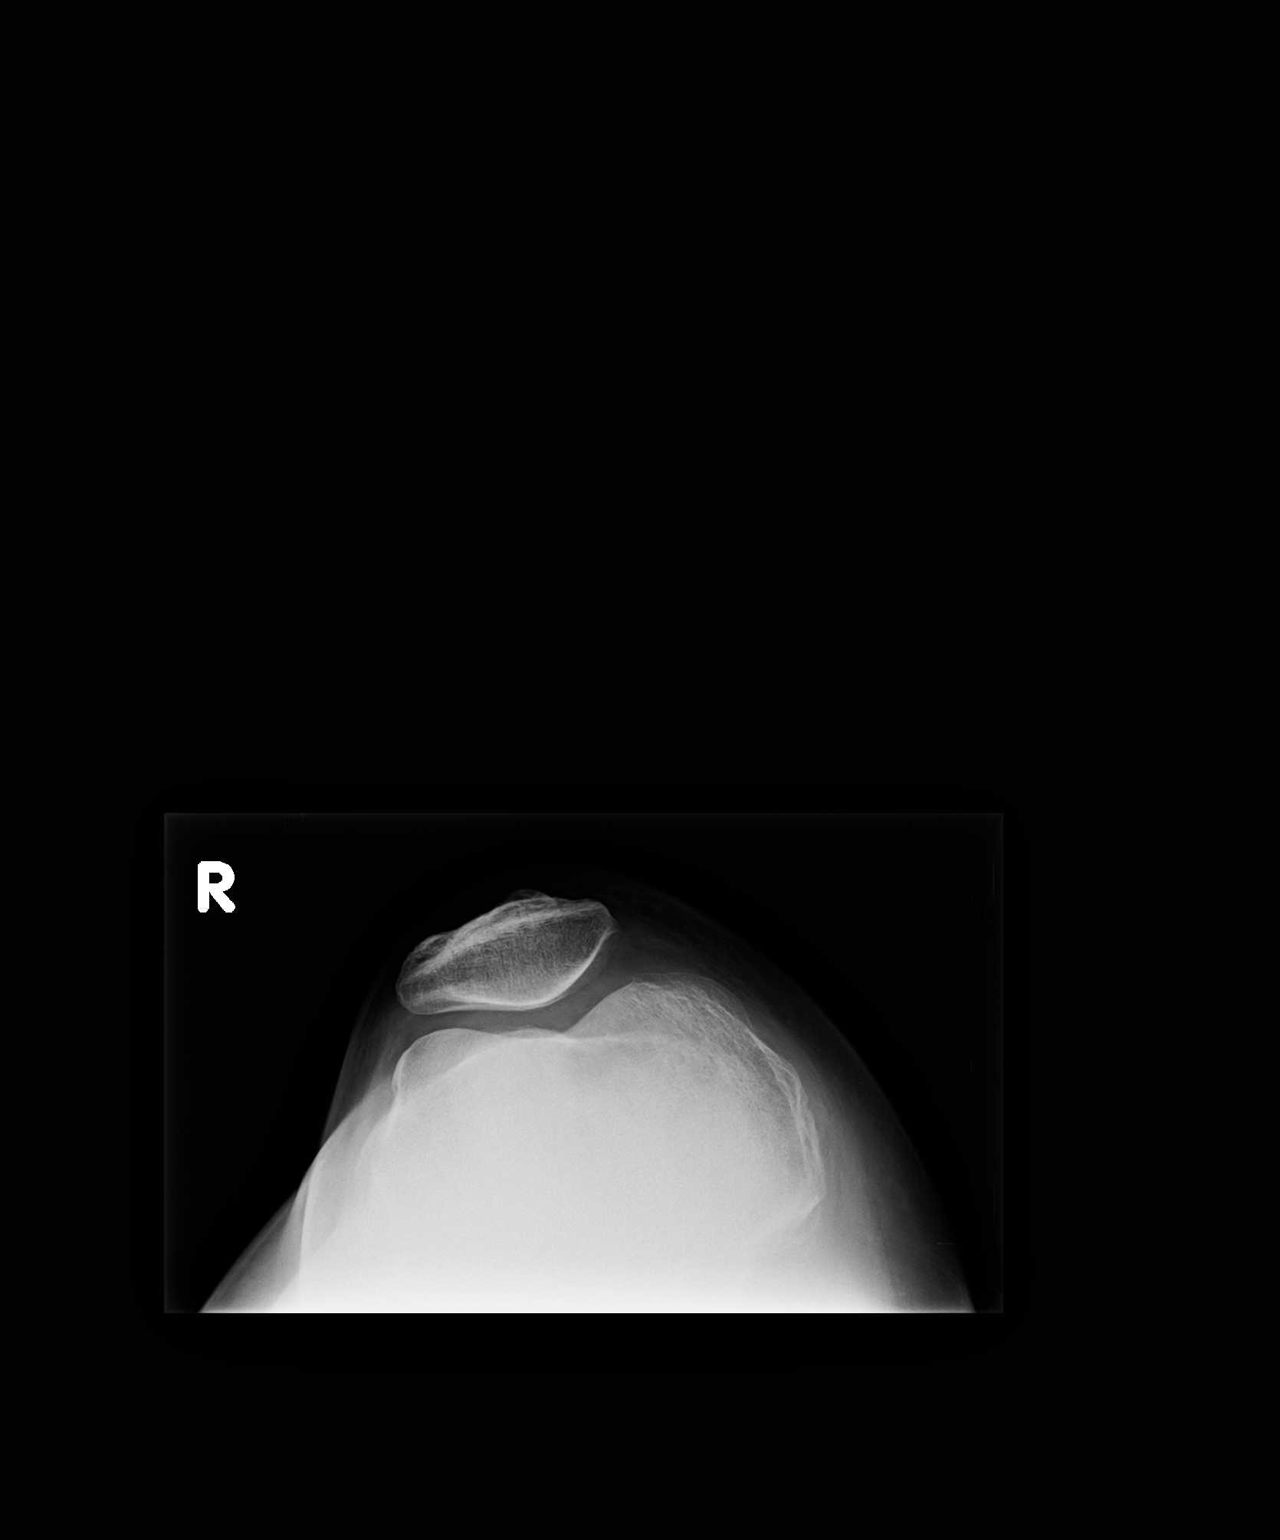

[knee lat]
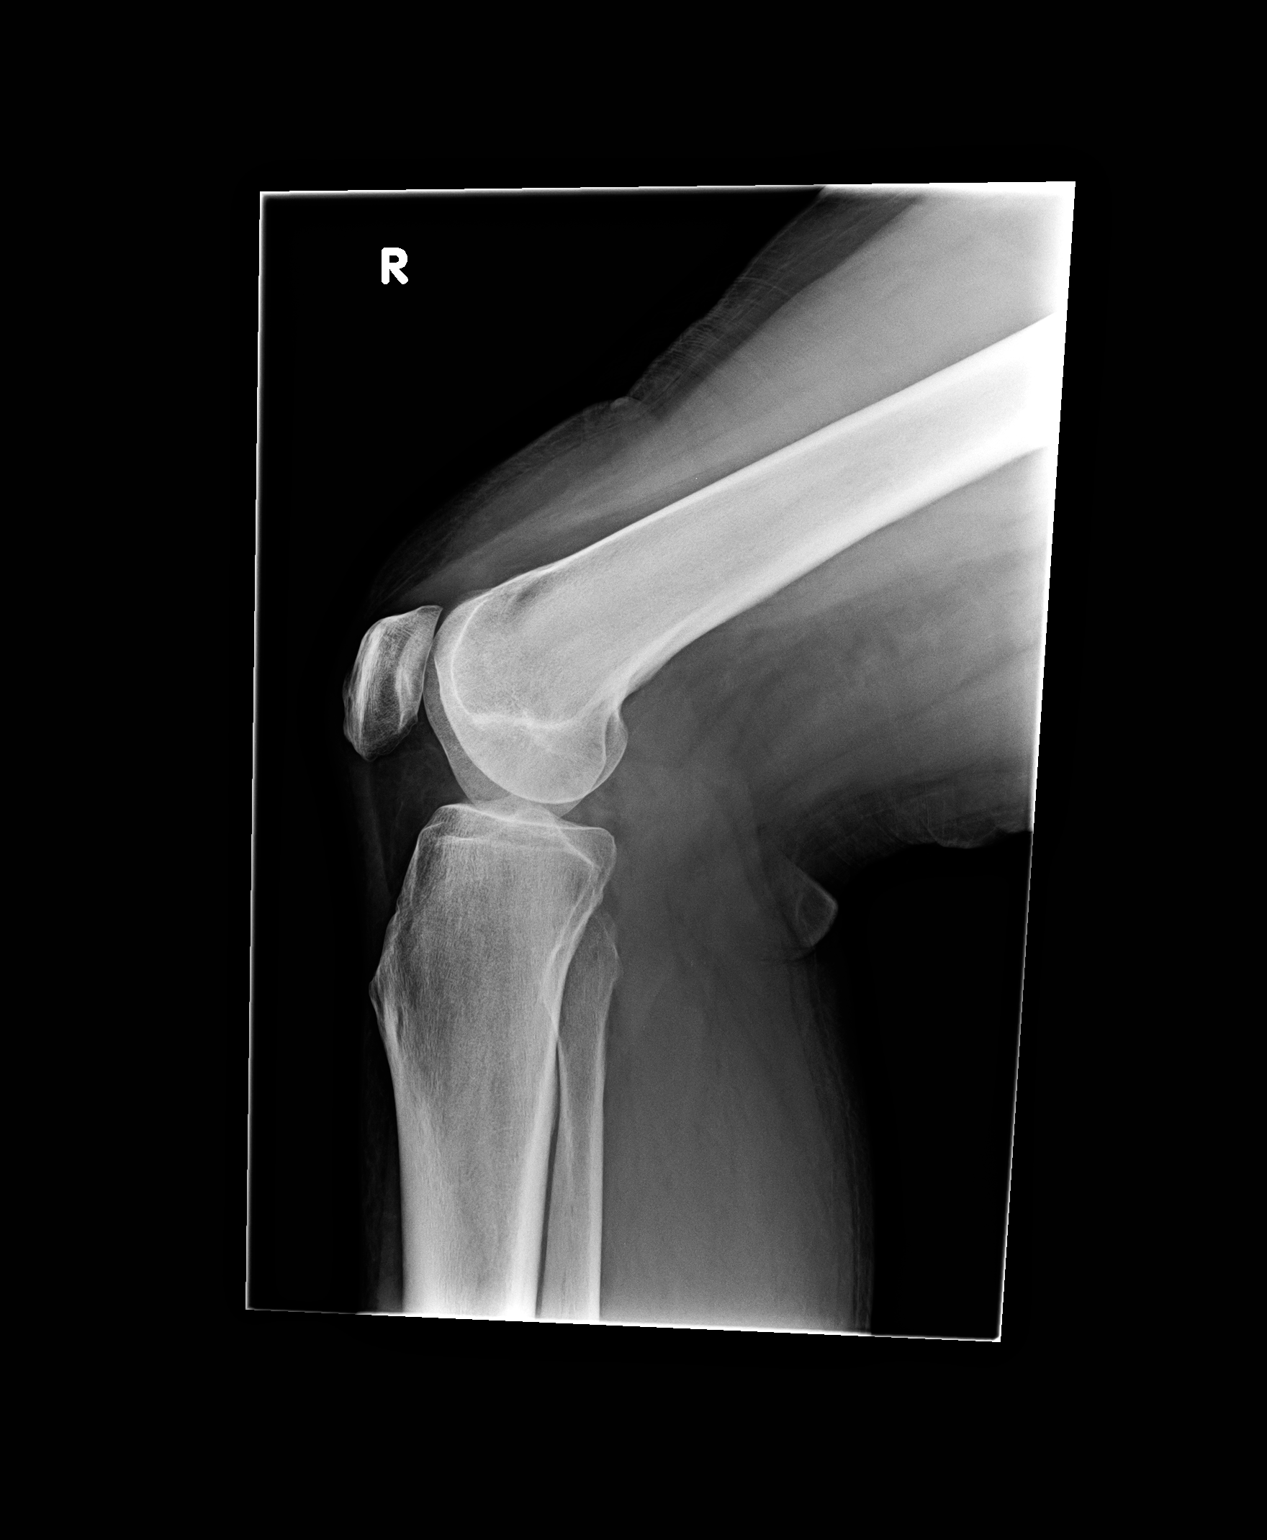

[3 of 3 positions shown; findings below may reference images not displayed]

FINDINGS: BONES/JOINTS:  NO definite fracture or dislocation.

SOFT TISSUES:  No radiopaque foreign body.
IMPRESSION: - There is NO evidence of acute fractures or dislocations.

Tech Notes:

Pt c/o right knee pain. Fall. CF

## 2019-12-05 ENCOUNTER — Encounter: Admit: 2019-12-05 | Discharge: 2019-12-05

## 2019-12-14 ENCOUNTER — Encounter: Admit: 2019-12-14 | Discharge: 2019-12-14

## 2019-12-19 ENCOUNTER — Encounter: Admit: 2019-12-19 | Discharge: 2019-12-19

## 2019-12-29 ENCOUNTER — Encounter: Admit: 2019-12-29 | Discharge: 2019-12-29

## 2019-12-29 DIAGNOSIS — I10 Essential (primary) hypertension: Secondary | ICD-10-CM

## 2019-12-29 MED ORDER — LISINOPRIL 20 MG PO TAB
ORAL_TABLET | Freq: Every day | 0 refills | Status: DC
Start: 2019-12-29 — End: 2020-02-06

## 2019-12-29 MED ORDER — HYDROCHLOROTHIAZIDE 25 MG PO TAB
ORAL_TABLET | Freq: Every day | ORAL | 0 refills | 28.00000 days | Status: DC
Start: 2019-12-29 — End: 2020-02-06

## 2019-12-29 NOTE — Telephone Encounter
Last seen 11/06/19 per Dr. Candiss Norse (Acute non-recurrent maxillary sinusitis)  No upcoming appointment

## 2020-02-05 ENCOUNTER — Encounter: Admit: 2020-02-05 | Discharge: 2020-02-05

## 2020-02-06 ENCOUNTER — Encounter: Admit: 2020-02-06 | Discharge: 2020-02-06

## 2020-02-06 ENCOUNTER — Ambulatory Visit: Admit: 2020-02-06 | Discharge: 2020-02-06 | Payer: Medicaid Other

## 2020-02-06 ENCOUNTER — Ambulatory Visit: Admit: 2020-02-06 | Discharge: 2020-02-06

## 2020-02-06 DIAGNOSIS — Z1159 Encounter for screening for other viral diseases: Secondary | ICD-10-CM

## 2020-02-06 DIAGNOSIS — I10 Essential (primary) hypertension: Secondary | ICD-10-CM

## 2020-02-06 DIAGNOSIS — Z0000 Encounter for general adult medical examination without abnormal findings: Secondary | ICD-10-CM

## 2020-02-06 DIAGNOSIS — K219 Gastro-esophageal reflux disease without esophagitis: Secondary | ICD-10-CM

## 2020-02-06 DIAGNOSIS — C539 Malignant neoplasm of cervix uteri, unspecified: Secondary | ICD-10-CM

## 2020-02-06 DIAGNOSIS — M1990 Unspecified osteoarthritis, unspecified site: Secondary | ICD-10-CM

## 2020-02-06 DIAGNOSIS — M25511 Pain in right shoulder: Secondary | ICD-10-CM

## 2020-02-06 DIAGNOSIS — G959 Disease of spinal cord, unspecified: Secondary | ICD-10-CM

## 2020-02-06 DIAGNOSIS — E1165 Type 2 diabetes mellitus with hyperglycemia: Secondary | ICD-10-CM

## 2020-02-06 DIAGNOSIS — G4733 Obstructive sleep apnea (adult) (pediatric): Secondary | ICD-10-CM

## 2020-02-06 DIAGNOSIS — M5030 Other cervical disc degeneration, unspecified cervical region: Secondary | ICD-10-CM

## 2020-02-06 DIAGNOSIS — F419 Anxiety disorder, unspecified: Secondary | ICD-10-CM

## 2020-02-06 DIAGNOSIS — F410 Panic disorder [episodic paroxysmal anxiety] without agoraphobia: Secondary | ICD-10-CM

## 2020-02-06 DIAGNOSIS — R52 Pain, unspecified: Secondary | ICD-10-CM

## 2020-02-06 DIAGNOSIS — R519 Generalized headaches: Secondary | ICD-10-CM

## 2020-02-06 DIAGNOSIS — F429 Obsessive-compulsive disorder, unspecified: Secondary | ICD-10-CM

## 2020-02-06 DIAGNOSIS — E785 Hyperlipidemia, unspecified: Secondary | ICD-10-CM

## 2020-02-06 DIAGNOSIS — T148XXA Other injury of unspecified body region, initial encounter: Secondary | ICD-10-CM

## 2020-02-06 DIAGNOSIS — E119 Type 2 diabetes mellitus without complications: Secondary | ICD-10-CM

## 2020-02-06 DIAGNOSIS — D229 Melanocytic nevi, unspecified: Secondary | ICD-10-CM

## 2020-02-06 DIAGNOSIS — A64 Unspecified sexually transmitted disease: Secondary | ICD-10-CM

## 2020-02-06 DIAGNOSIS — F329 Major depressive disorder, single episode, unspecified: Secondary | ICD-10-CM

## 2020-02-06 DIAGNOSIS — F331 Major depressive disorder, recurrent, moderate: Secondary | ICD-10-CM

## 2020-02-06 DIAGNOSIS — M25559 Pain in unspecified hip: Secondary | ICD-10-CM

## 2020-02-06 LAB — CBC AND DIFF
Lab: 0 % (ref >60)
Lab: 0 10*3/uL (ref 0–0.20)
Lab: 0 10*3/uL (ref 0–0.45)
Lab: 0.5 10*3/uL (ref 0–0.80)
Lab: 1 % — ABNORMAL LOW (ref >60)
Lab: 11 K/UL — ABNORMAL HIGH (ref 4.5–11.0)
Lab: 13 % (ref 11–15)
Lab: 15 g/dL — ABNORMAL HIGH (ref 12.0–15.0)
Lab: 2.5 10*3/uL (ref 1.0–4.8)
Lab: 22 % — ABNORMAL LOW (ref 24–44)
Lab: 246 K/UL (ref 150–400)
Lab: 30 pg — ABNORMAL HIGH (ref 26–34)
Lab: 33 g/dL (ref 32.0–36.0)
Lab: 46 % — ABNORMAL HIGH (ref 36–45)
Lab: 5 % (ref 4–12)
Lab: 5.1 M/UL — ABNORMAL HIGH (ref 4.0–5.0)
Lab: 72 % (ref 41–77)
Lab: 8.3 10*3/uL — ABNORMAL HIGH (ref 1.8–7.0)
Lab: 8.8 FL (ref 7–11)
Lab: 90 FL (ref 80–100)

## 2020-02-06 LAB — LIPID PROFILE
Lab: 155 mg/dL — ABNORMAL HIGH (ref <100)
Lab: 242 mg/dL — ABNORMAL HIGH (ref <200)
Lab: 338 mg/dL — ABNORMAL HIGH (ref <150)

## 2020-02-06 LAB — HEMOGLOBIN A1C: Lab: 12 % — ABNORMAL HIGH (ref 4.0–6.0)

## 2020-02-06 LAB — COMPREHENSIVE METABOLIC PANEL
Lab: 133 MMOL/L — ABNORMAL LOW (ref 137–147)
Lab: 4.4 MMOL/L (ref 3.5–5.1)

## 2020-02-06 LAB — MICROALB/CR RATIO-URINE RANDOM
Lab: 40 ug/mL — ABNORMAL HIGH (ref <19)
Lab: 46 ug/mg — ABNORMAL HIGH (ref <30)
Lab: 87 mg/dL

## 2020-02-06 LAB — TSH WITH FREE T4 REFLEX: Lab: 1 uU/mL — ABNORMAL LOW (ref >40)

## 2020-02-06 LAB — HEPATITIS C ANTIBODY W REFLEX HCV PCR QUANT

## 2020-02-06 MED ORDER — NICOTINE 7 MG/24 HR TD PT24
1 | MEDICATED_PATCH | TRANSDERMAL | 0 refills | Status: AC
Start: 2020-02-06 — End: ?

## 2020-02-06 MED ORDER — LISINOPRIL 20 MG PO TAB
20 mg | ORAL_TABLET | Freq: Every day | ORAL | 1 refills | Status: AC
Start: 2020-02-06 — End: ?

## 2020-02-06 MED ORDER — NICOTINE 21 MG/24 HR TD PT24
1 | MEDICATED_PATCH | TRANSDERMAL | 0 refills | Status: AC
Start: 2020-02-06 — End: ?

## 2020-02-06 MED ORDER — NICOTINE 14 MG/24 HR TD PT24
1 | MEDICATED_PATCH | TRANSDERMAL | 0 refills | Status: AC
Start: 2020-02-06 — End: ?

## 2020-02-06 MED ORDER — NAPROXEN 500 MG PO TAB
500 mg | ORAL_TABLET | Freq: Two times a day (BID) | ORAL | 3 refills | Status: AC
Start: 2020-02-06 — End: ?

## 2020-02-06 MED ORDER — GLIMEPIRIDE 4 MG PO TAB
4 mg | ORAL_TABLET | Freq: Every day | ORAL | 1 refills | Status: DC
Start: 2020-02-06 — End: 2020-02-13

## 2020-02-06 MED ORDER — HYDROCHLOROTHIAZIDE 25 MG PO TAB
25 mg | ORAL_TABLET | Freq: Every morning | ORAL | 1 refills | 28.00000 days | Status: AC
Start: 2020-02-06 — End: ?

## 2020-02-06 NOTE — Patient Instructions
Our records show that you need to either schedule an appointment or have missed appointment with our clinic. If you would like to schedule your appointment , please call our office at [phone] and choose option number 2.If you would like to speak with a nurse before we schedule your appointment please call [phone] and choose option 3.   ?               Please bring your photo ID , insurance cards, and any co payments with you to your appointment.   Our offices are located at -               * [address], [place], [place] [address]  ?                     Sincerely,                [facility]   [address] [place], 15-Aug-1966            Page: 1 of 1  Phone: [phone]  Fax: [phone]

## 2020-02-07 ENCOUNTER — Encounter: Admit: 2020-02-07 | Discharge: 2020-02-07

## 2020-02-09 ENCOUNTER — Encounter: Admit: 2020-02-09 | Discharge: 2020-02-09

## 2020-02-09 DIAGNOSIS — J309 Allergic rhinitis, unspecified: Secondary | ICD-10-CM

## 2020-02-09 MED ORDER — FLUTICASONE PROPIONATE 50 MCG/ACTUATION NA SPSN
Freq: Two times a day (BID) | NASAL | 0 refills | 60.00000 days | Status: AC
Start: 2020-02-09 — End: ?

## 2020-02-09 MED ORDER — ALBUTEROL SULFATE 90 MCG/ACTUATION IN HFAA
Freq: Four times a day (QID) | 0 refills | Status: DC | PRN
Start: 2020-02-09 — End: 2020-04-26

## 2020-02-13 ENCOUNTER — Encounter: Admit: 2020-02-13 | Discharge: 2020-02-13

## 2020-02-13 MED ORDER — GLIMEPIRIDE 4 MG PO TAB
4 mg | ORAL_TABLET | Freq: Two times a day (BID) | ORAL | 0 refills | Status: AC
Start: 2020-02-13 — End: ?

## 2020-02-13 MED ORDER — DOXYCYCLINE HYCLATE 100 MG PO TAB
100 mg | ORAL_TABLET | Freq: Two times a day (BID) | ORAL | 0 refills | 8.00000 days | Status: AC
Start: 2020-02-13 — End: ?

## 2020-02-13 NOTE — Telephone Encounter
see result note 

## 2020-02-13 NOTE — Telephone Encounter
-----   Message from Jeanella Flattery, MD sent at 02/08/2020 12:41 PM CDT -----  Pt has microalbuminuria. Cont losartan for renal protection. Hep c neg. Tsh wnl. A1c 12.3 pt should resume glimepiride at 4mg  bid. Please update rx. F/u in 3 mo. LDL elevated.  Pt should increase daily exercise/vegetables.  Decrease fats/meats.  Pt should start atorvastatin 40 mg qhs. Schedule telehealth if she has questions about this. Mild renal impairment. Increase daily water intake. Mild wbc elevation.  Please send rx for doxycycline 100 mg bid x 10 days for sinus infection

## 2020-02-14 MED ORDER — DAPAGLIFLOZIN 5 MG PO TAB
5 mg | ORAL_TABLET | Freq: Every day | ORAL | 3 refills | Status: AC
Start: 2020-02-14 — End: ?

## 2020-02-14 MED ORDER — ATORVASTATIN 40 MG PO TAB
40 mg | ORAL_TABLET | Freq: Every evening | ORAL | 3 refills | Status: AC
Start: 2020-02-14 — End: ?

## 2020-02-15 ENCOUNTER — Encounter: Admit: 2020-02-15 | Discharge: 2020-02-15

## 2020-03-18 ENCOUNTER — Ambulatory Visit: Admit: 2020-03-18 | Discharge: 2020-03-18 | Payer: Medicaid Other

## 2020-03-18 ENCOUNTER — Encounter: Admit: 2020-03-18 | Discharge: 2020-03-18

## 2020-03-18 NOTE — Telephone Encounter
Patient calls and leaves a voice mail stating that she misread the directions on her glimepiride bottle and has been taking 2 pills instead of one.    Called patient to advise that per directions on medication she is correct on taking 1 tab bid.  No answer, left message advising of above.

## 2020-04-09 ENCOUNTER — Encounter: Admit: 2020-04-09 | Discharge: 2020-04-09

## 2020-04-09 NOTE — Telephone Encounter
Attempted to reach patient for our scheduled telephone appointment. Unable to reach patient. Multiple messages left on machine with contact and rescheduling information. MyChart message sent.   Gurbani Figge, MS RD LD - [phone]

## 2020-04-25 ENCOUNTER — Encounter: Admit: 2020-04-25 | Discharge: 2020-04-25

## 2020-04-26 MED ORDER — ALBUTEROL SULFATE 90 MCG/ACTUATION IN HFAA
2 | RESPIRATORY_TRACT | 3 refills | Status: AC | PRN
Start: 2020-04-26 — End: ?

## 2020-04-29 ENCOUNTER — Encounter: Admit: 2020-04-29 | Discharge: 2020-04-29

## 2020-04-30 ENCOUNTER — Encounter: Admit: 2020-04-30 | Discharge: 2020-04-30

## 2020-05-14 ENCOUNTER — Encounter: Admit: 2020-05-14 | Discharge: 2020-05-14

## 2020-06-06 ENCOUNTER — Encounter: Admit: 2020-06-06 | Discharge: 2020-06-06

## 2020-06-06 DIAGNOSIS — E1165 Type 2 diabetes mellitus with hyperglycemia: Secondary | ICD-10-CM

## 2020-06-07 MED ORDER — FARXIGA 5 MG PO TAB
ORAL_TABLET | Freq: Every day | 0 refills | Status: AC
Start: 2020-06-07 — End: ?

## 2020-06-07 NOTE — Telephone Encounter
Last seen 02/14/2020 per Dr. Thedore Mins (Type 2 DM)  No upcoming appointment

## 2020-06-13 ENCOUNTER — Encounter: Admit: 2020-06-13 | Discharge: 2020-06-13

## 2020-07-02 ENCOUNTER — Encounter: Admit: 2020-07-02 | Discharge: 2020-07-02

## 2020-07-09 ENCOUNTER — Encounter: Admit: 2020-07-09 | Discharge: 2020-07-09

## 2020-07-09 DIAGNOSIS — E1165 Type 2 diabetes mellitus with hyperglycemia: Secondary | ICD-10-CM

## 2020-07-09 MED ORDER — FARXIGA 5 MG PO TAB
ORAL_TABLET | Freq: Every day | 0 refills
Start: 2020-07-09 — End: ?

## 2020-07-09 MED ORDER — GLIMEPIRIDE 4 MG PO TAB
ORAL_TABLET | Freq: Two times a day (BID) | 0 refills
Start: 2020-07-09 — End: ?

## 2020-08-06 ENCOUNTER — Encounter: Admit: 2020-08-06 | Discharge: 2020-08-06

## 2020-08-06 ENCOUNTER — Ambulatory Visit: Admit: 2020-08-06 | Discharge: 2020-08-07 | Payer: Medicaid Other

## 2020-08-06 DIAGNOSIS — G8929 Other chronic pain: Secondary | ICD-10-CM

## 2020-08-06 DIAGNOSIS — Z23 Encounter for immunization: Principal | ICD-10-CM

## 2020-08-06 DIAGNOSIS — I10 Essential (primary) hypertension: Secondary | ICD-10-CM

## 2020-08-06 DIAGNOSIS — F429 Obsessive-compulsive disorder, unspecified: Secondary | ICD-10-CM

## 2020-08-06 DIAGNOSIS — M1990 Unspecified osteoarthritis, unspecified site: Secondary | ICD-10-CM

## 2020-08-06 DIAGNOSIS — F410 Panic disorder [episodic paroxysmal anxiety] without agoraphobia: Secondary | ICD-10-CM

## 2020-08-06 DIAGNOSIS — E785 Hyperlipidemia, unspecified: Secondary | ICD-10-CM

## 2020-08-06 DIAGNOSIS — H66001 Acute suppurative otitis media without spontaneous rupture of ear drum, right ear: Secondary | ICD-10-CM

## 2020-08-06 DIAGNOSIS — M25559 Pain in unspecified hip: Secondary | ICD-10-CM

## 2020-08-06 DIAGNOSIS — M5030 Other cervical disc degeneration, unspecified cervical region: Secondary | ICD-10-CM

## 2020-08-06 DIAGNOSIS — G4733 Obstructive sleep apnea (adult) (pediatric): Secondary | ICD-10-CM

## 2020-08-06 DIAGNOSIS — M25532 Pain in left wrist: Secondary | ICD-10-CM

## 2020-08-06 DIAGNOSIS — F32A Depression: Secondary | ICD-10-CM

## 2020-08-06 DIAGNOSIS — K219 Gastro-esophageal reflux disease without esophagitis: Secondary | ICD-10-CM

## 2020-08-06 DIAGNOSIS — R519 Generalized headaches: Secondary | ICD-10-CM

## 2020-08-06 DIAGNOSIS — F419 Anxiety disorder, unspecified: Secondary | ICD-10-CM

## 2020-08-06 DIAGNOSIS — T148XXA Other injury of unspecified body region, initial encounter: Secondary | ICD-10-CM

## 2020-08-06 DIAGNOSIS — A64 Unspecified sexually transmitted disease: Secondary | ICD-10-CM

## 2020-08-06 DIAGNOSIS — C539 Malignant neoplasm of cervix uteri, unspecified: Secondary | ICD-10-CM

## 2020-08-06 DIAGNOSIS — E119 Type 2 diabetes mellitus without complications: Secondary | ICD-10-CM

## 2020-08-06 DIAGNOSIS — R52 Pain, unspecified: Secondary | ICD-10-CM

## 2020-08-06 MED ORDER — DOXYCYCLINE HYCLATE 100 MG PO TAB
100 mg | ORAL_TABLET | Freq: Two times a day (BID) | ORAL | 0 refills | 8.00000 days | Status: AC
Start: 2020-08-06 — End: ?

## 2020-08-06 MED ORDER — MISCELLANEOUS MEDICAL SUPPLY MISC MISC
0 refills | 1.00000 days | Status: AC
Start: 2020-08-06 — End: ?

## 2020-08-06 MED ORDER — OXYCODONE 5 MG PO TAB
5 mg | ORAL_TABLET | ORAL | 0 refills | 6.00000 days | Status: AC | PRN
Start: 2020-08-06 — End: ?

## 2020-08-06 MED ORDER — FARXIGA 10 MG PO TAB
10 mg | ORAL_TABLET | Freq: Every day | ORAL | 3 refills | Status: AC
Start: 2020-08-06 — End: ?

## 2020-08-07 DIAGNOSIS — E1165 Type 2 diabetes mellitus with hyperglycemia: Secondary | ICD-10-CM

## 2020-08-12 ENCOUNTER — Encounter: Admit: 2020-08-12 | Discharge: 2020-08-12

## 2020-08-12 NOTE — Telephone Encounter
Patient calls requesting referral for physical therapy be faxed to [phone].    PT referral faxed.

## 2020-09-05 ENCOUNTER — Encounter: Admit: 2020-09-05 | Discharge: 2020-09-05

## 2020-09-05 NOTE — Telephone Encounter
Patient calls and leaves a voice mail stating that she was given an abx for an ear infection and states that she would like to see if we can send in a refill.  Patient states that she is having the same problem again and is experiencing left sinus fullness, coughing up phlegm, fever for days of 101-102 which has resolved noe and chest and nasal congestion.      Called patient to advise per Dr. Candiss Norse that she needs to schedule an appointment to be seen.  No answer, left message (consent on file) advising of above as per Dr. Candiss Norse.

## 2020-09-17 ENCOUNTER — Encounter: Admit: 2020-09-17 | Discharge: 2020-09-17

## 2020-11-08 IMAGING — CR [ID]
2 series · 2 of 2 positions shown · non-contrast
Comparison: none

[tspine ap]
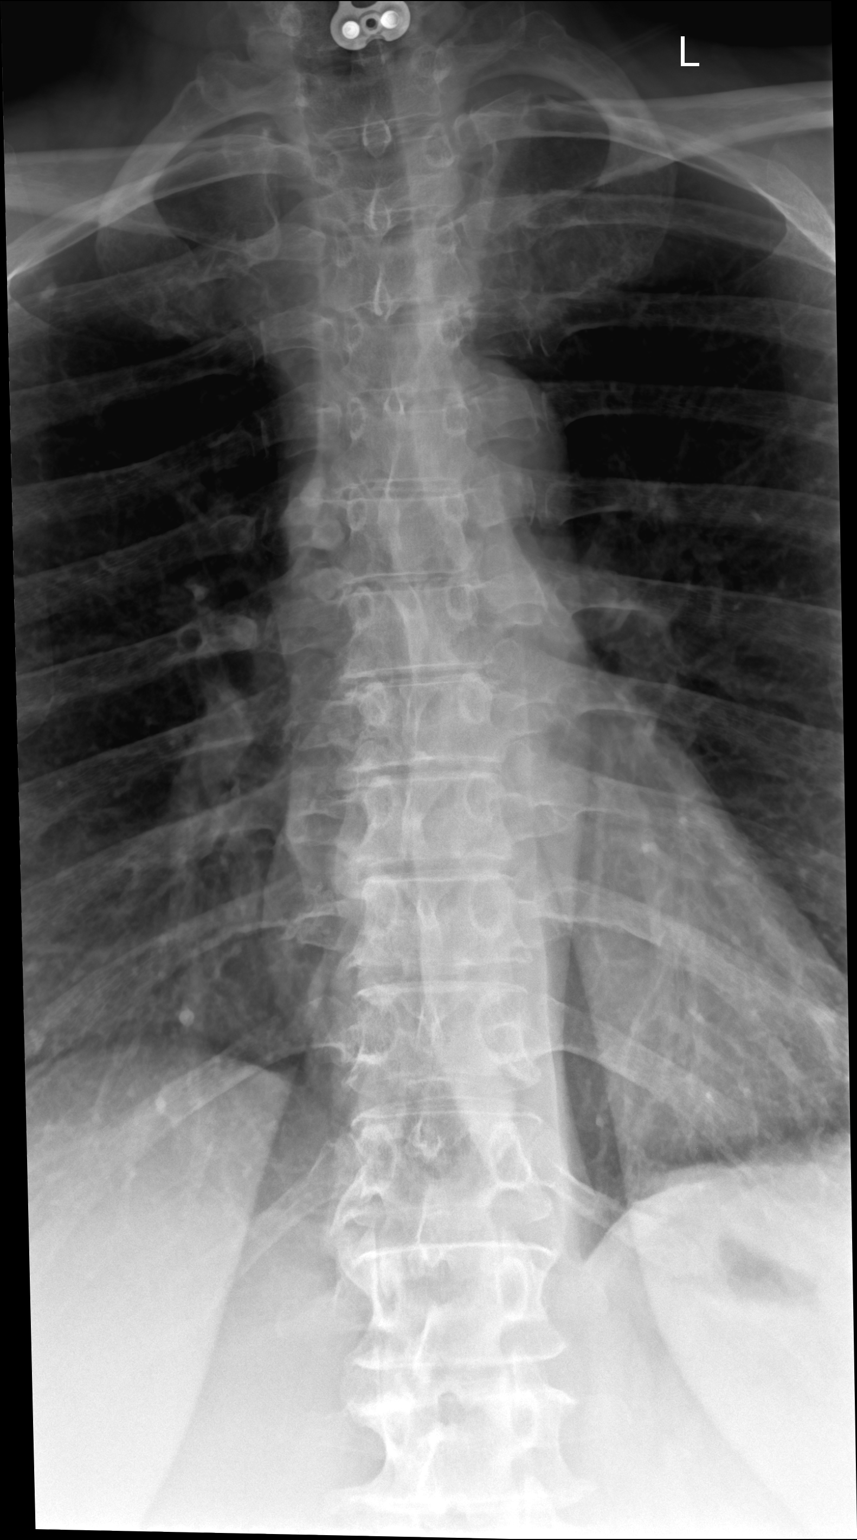

[tspine swimmers]
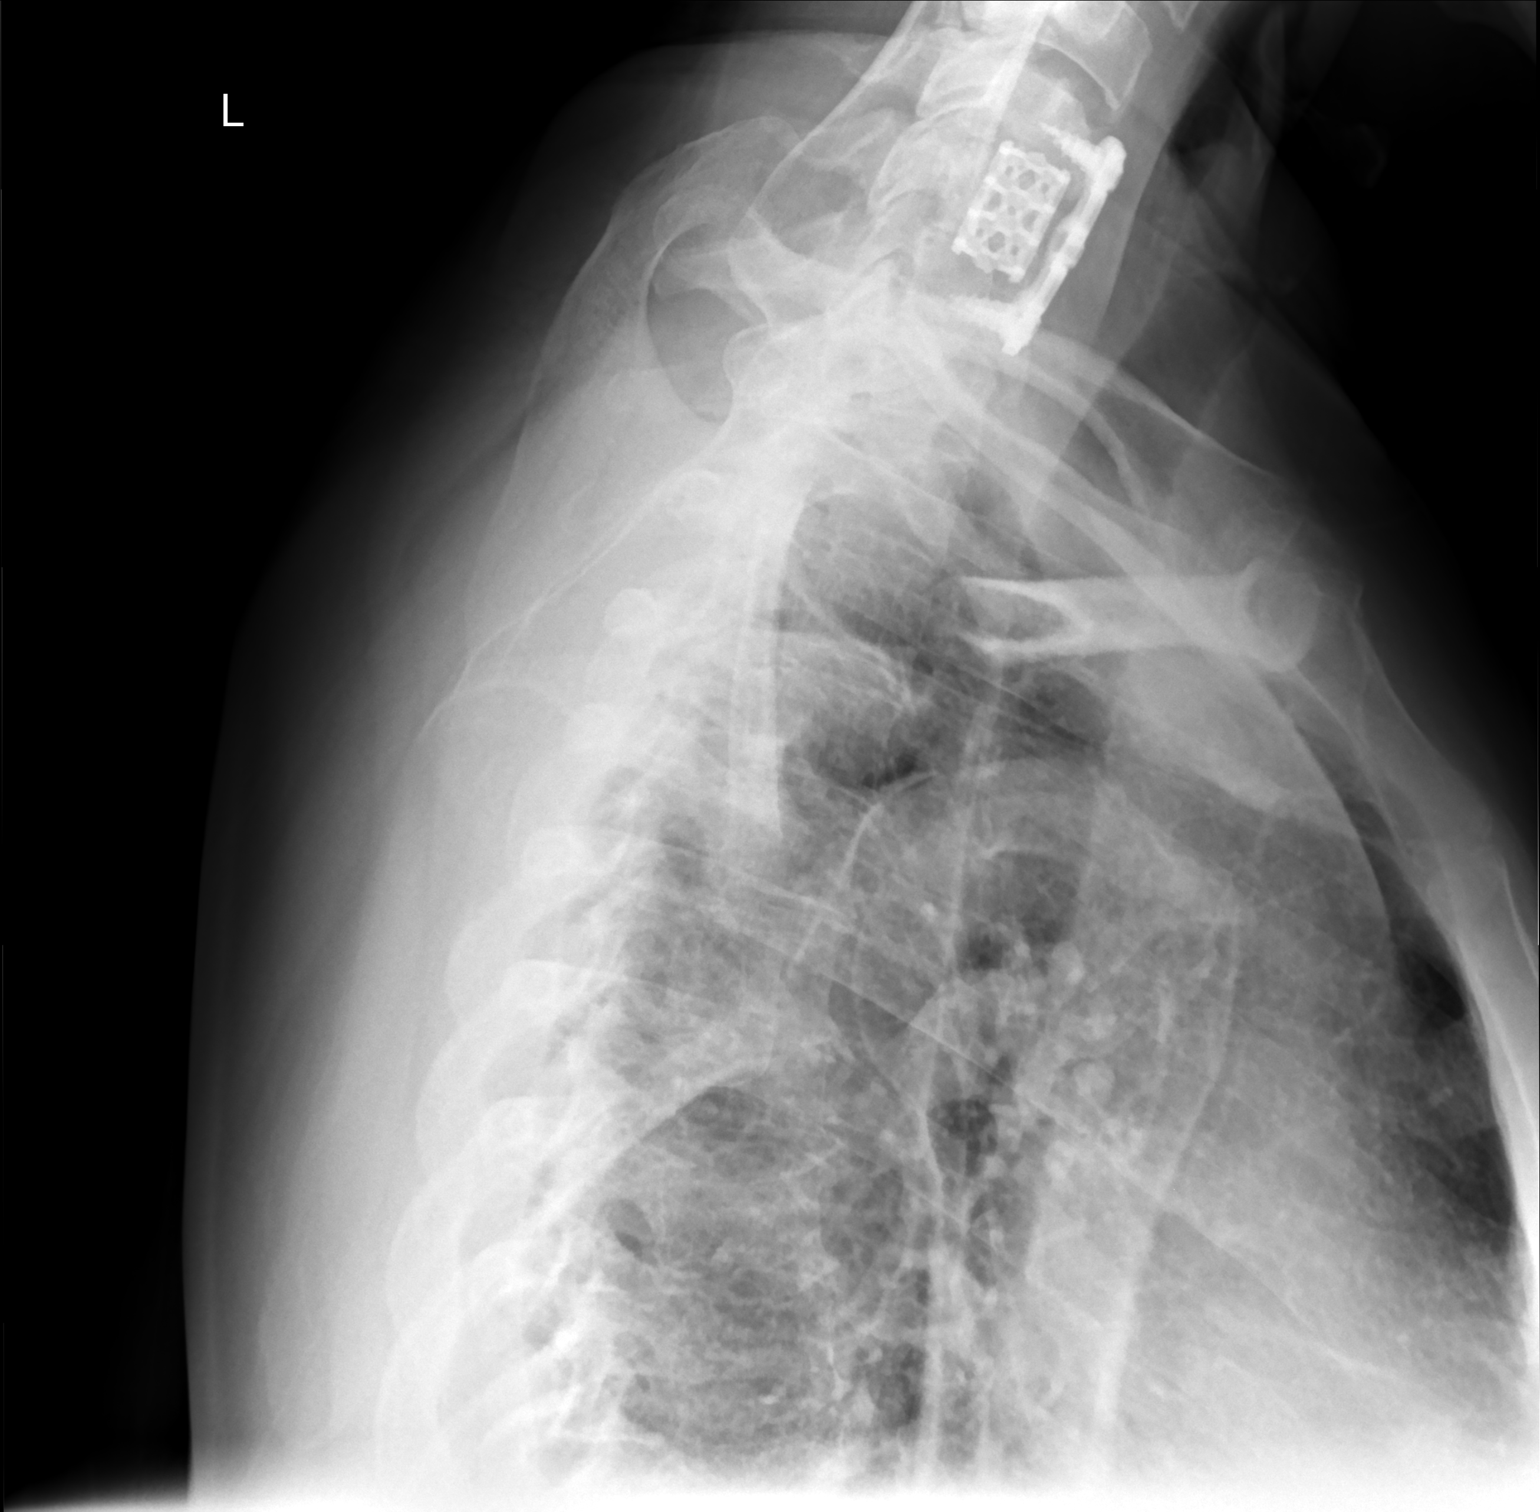

[2 of 2 positions shown; findings below may reference images not displayed]

11/08/20

EXAM

RADIOLOGIC EXAMINATION, SPINE; THORACIC, 3 VIEWS, CPT 10710

INDICATION

Mid back pain x 1 month, history of c-spine fusion. RG

TECHNIQUE

3 views of the thoracic spine were acquired

COMPARISONS

There are no previous examinations available for comparison at the time of dictation.

FINDINGS

Postsurgical changes in the cervical spine are noted. The anterior fusion an vertebral body cage
are identified along the lower cervical spine.

AP and lateral views of the thoracic spine show 12 rib-bearing thoracic vertebral bodies. AP and
lateral line are maintained. Vertebral body are preserved. There is multilevel thoracic spondylosis.
Pedicles appear intact.

IMPRESSION

Multilevel thoracic spondylosis. No acute abnormalities are present.

Tech Notes:

Mid back pain x 1 month, history of c-spine fusion.RG

## 2020-11-16 ENCOUNTER — Encounter

## 2020-11-16 DIAGNOSIS — M546 Pain in thoracic spine: Secondary | ICD-10-CM

## 2020-11-16 MED ORDER — CYCLOBENZAPRINE 10 MG PO TAB
10 mg | ORAL_TABLET | Freq: Every evening | ORAL | 0 refills | 21.00000 days | Status: AC | PRN
Start: 2020-11-16 — End: ?

## 2020-11-16 NOTE — Telephone Encounter
Onset of right sided upper back pain a month ago. The pain radiated to right ribs and across her back, worse with movement. Was seen in the ED in [place], [place] and was given medrol dose pack. She already had naproxen and prn oxycodone and with all of that her pain was improved but she has cont right lateral back pain that radiates under rib cage. She denies chest pain, soa, nausea or vomiting,hematuria, dysuria. She is requesting a muscle relaxer. She is a diabetic, due for a fu a1c and states she will make fu appt with Dr Thedore Mins in regard to this and if needed fu in urgent care if pain worsens or changes. Will send in flexeril for her, given parameters for immediate follow up and she will schedule fu with pcp.    Medical History:   Diagnosis Date   ? Anxiety    ? Arthritis    ? 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 Chronic generalized pain    ? DDD (degenerative disc disease), cervical    ? Depression     Depression and Anxiety   ? DM2 (diabetes mellitus, type 2) (HCC)    ? Generalized headaches    ? GERD (gastroesophageal reflux disease)    ? Hip pain    ? HLD (hyperlipidemia)    ? HTN (hypertension)    ? Nerve injury    ? OCD (obsessive compulsive disorder)    ? OSA (obstructive sleep apnea)    ? Panic attacks    ? Sexually transmitted disease     HX of HPV     Surgical History:   Procedure Laterality Date   ? TONSILLECTOMY  1973   ? CHOLECYSTECTOMY  1990   ? TUBAL LIGATION  1991   ? ANTERIOR CERVICAL 5-7 FUSION N/A 11/18/2015    Performed by Catarina Hartshorn, MD at [facility]   ? VERTEBRECTOMY CERVICAL 6 N/A 11/18/2015    Performed by Catarina Hartshorn, MD at [facility] OR   ? HX TOTAL ABDOMINAL HYSTERECTOMY      2/2 cervical ca     Family History   Problem Relation Age of Onset   ? Arthritis Mother    ? Joint Pain Mother    ? Hypertension Mother    ? Heart problem Father    ? Cancer Maternal Grandfather         bladder   ? Diabetes Maternal Grandfather    ? Heart problem Brother    ? Lung Disease Brother      Social History Socioeconomic History   ? Marital status: Single     Spouse name: Not on file   ? Number of children: 2   ? Years of education: Not on file   ? Highest education level: Not on file   Occupational History   ? Not on file   Tobacco Use   ? Smoking status: Current Every Day Smoker     Packs/day: 1.00     Years: 38.00     Pack years: 38.00     Types: Cigarettes   ? Smokeless tobacco: Never Used   ? Tobacco comment: pt declined   Substance and Sexual Activity   ? Alcohol use: No     Alcohol/week: 0.0 standard drinks   ? Drug use: No   ? Sexual activity: Not Currently     Partners: Male     Birth control/protection: Surgical     Comment: Hysterectomy  Other Topics Concern   ? Not on file   Social History Narrative    [place]  No DV.  Feels safe.  Currently not working.  On SSI

## 2020-12-13 ENCOUNTER — Encounter: Admit: 2020-12-13 | Discharge: 2020-12-13

## 2020-12-13 DIAGNOSIS — Z1231 Encounter for screening mammogram for malignant neoplasm of breast: Secondary | ICD-10-CM

## 2020-12-25 ENCOUNTER — Encounter: Admit: 2020-12-25 | Discharge: 2020-12-25

## 2020-12-25 DIAGNOSIS — I10 Essential (primary) hypertension: Secondary | ICD-10-CM

## 2020-12-25 MED ORDER — LISINOPRIL 20 MG PO TAB
ORAL_TABLET | Freq: Every day | 0 refills | Status: AC
Start: 2020-12-25 — End: ?

## 2020-12-25 MED ORDER — HYDROCHLOROTHIAZIDE 25 MG PO TAB
ORAL_TABLET | Freq: Every day | ORAL | 0 refills | 28.00000 days | Status: AC
Start: 2020-12-25 — End: ?

## 2021-01-08 ENCOUNTER — Encounter: Admit: 2021-01-08 | Discharge: 2021-01-08

## 2021-01-08 DIAGNOSIS — Z1231 Encounter for screening mammogram for malignant neoplasm of breast: Secondary | ICD-10-CM

## 2021-01-10 IMAGING — CR ELBOWCMLT
2 series · 2 of 2 positions shown · non-contrast
Comparison: none

[elbow lat]
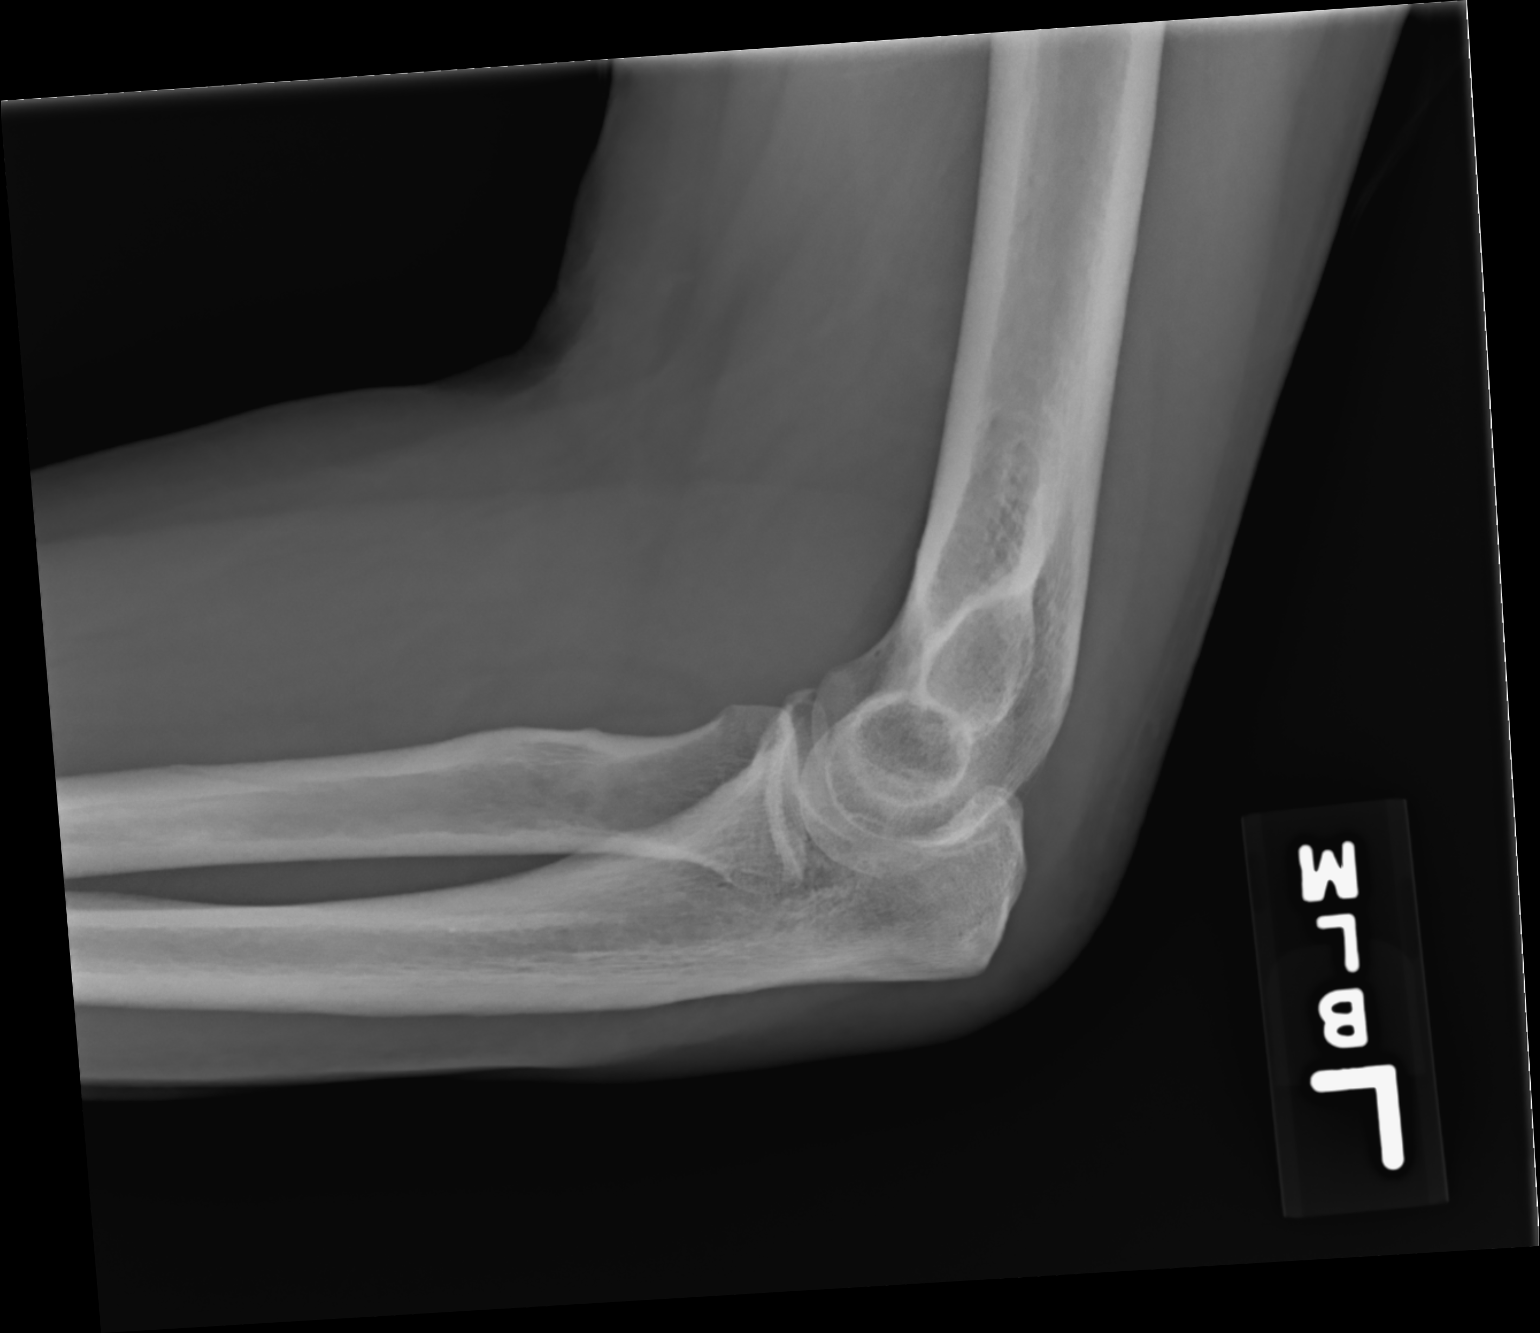

[elbow obl]
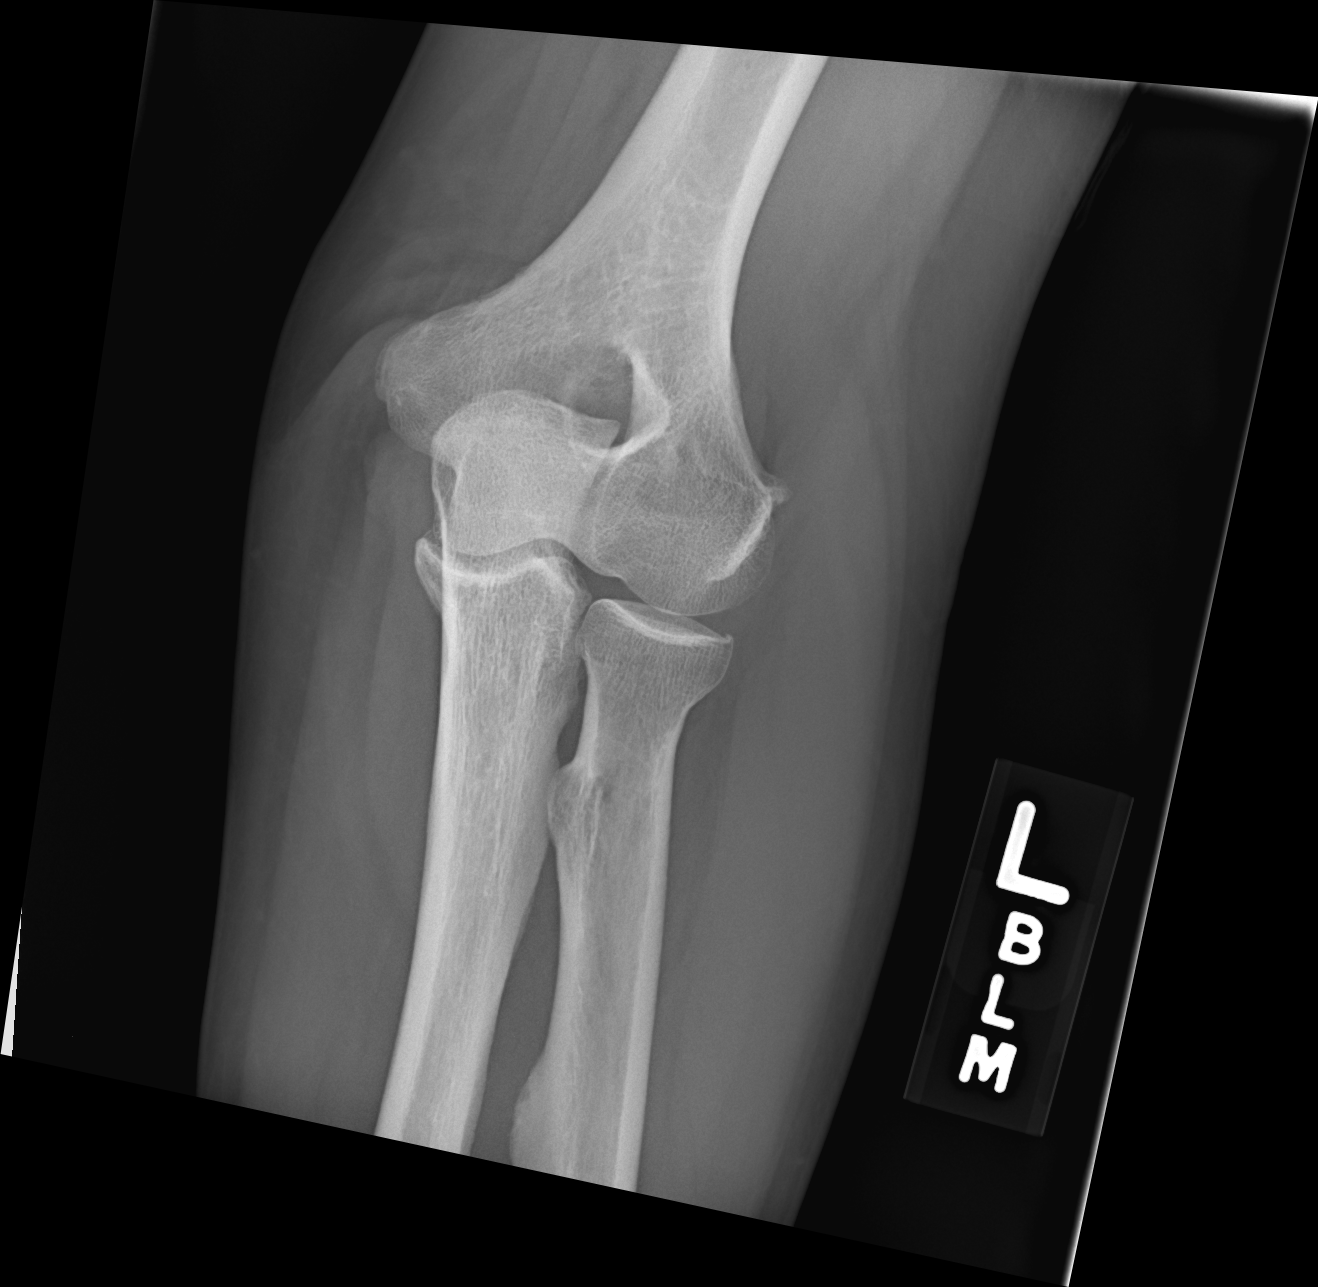

[2 of 2 positions shown; findings below may reference images not displayed]

01/10/21

EXAM

XR elbow LT min 3V

INDICATION

fall/pain
lt hand, wrist, and elbow pain after fall, pain, some abrasions, no other trauma or surgery hx

TECHNIQUE

Four views of the left elbow

COMPARISONS

None available at the time of dictation.

FINDINGS

No radiographic evidence of an acute fracture, osseous malalignment, or aggressive focal osseous
lesion. No posterior elbow joint effusion. Trace spurring at the coronoid process, which may be
sequelae of prior injury. Trace spurring at the lateral epicondyle, which may be related to prior
lateral epicondylitis.

IMPRESSION
1. No joint effusion.
2. No radiographic evidence of an acute osseous abnormality.
3. Suspicion for sequelae of prior injury to the coronoid process.

Tech Notes:

lt hand, wrist, and elbow pain after fall, pain, some abrasions, no other trauma or surgery hx

## 2021-01-10 IMAGING — CR [ID]
2 series · 2 of 2 positions shown · non-contrast
Comparison: none

[hand obl]
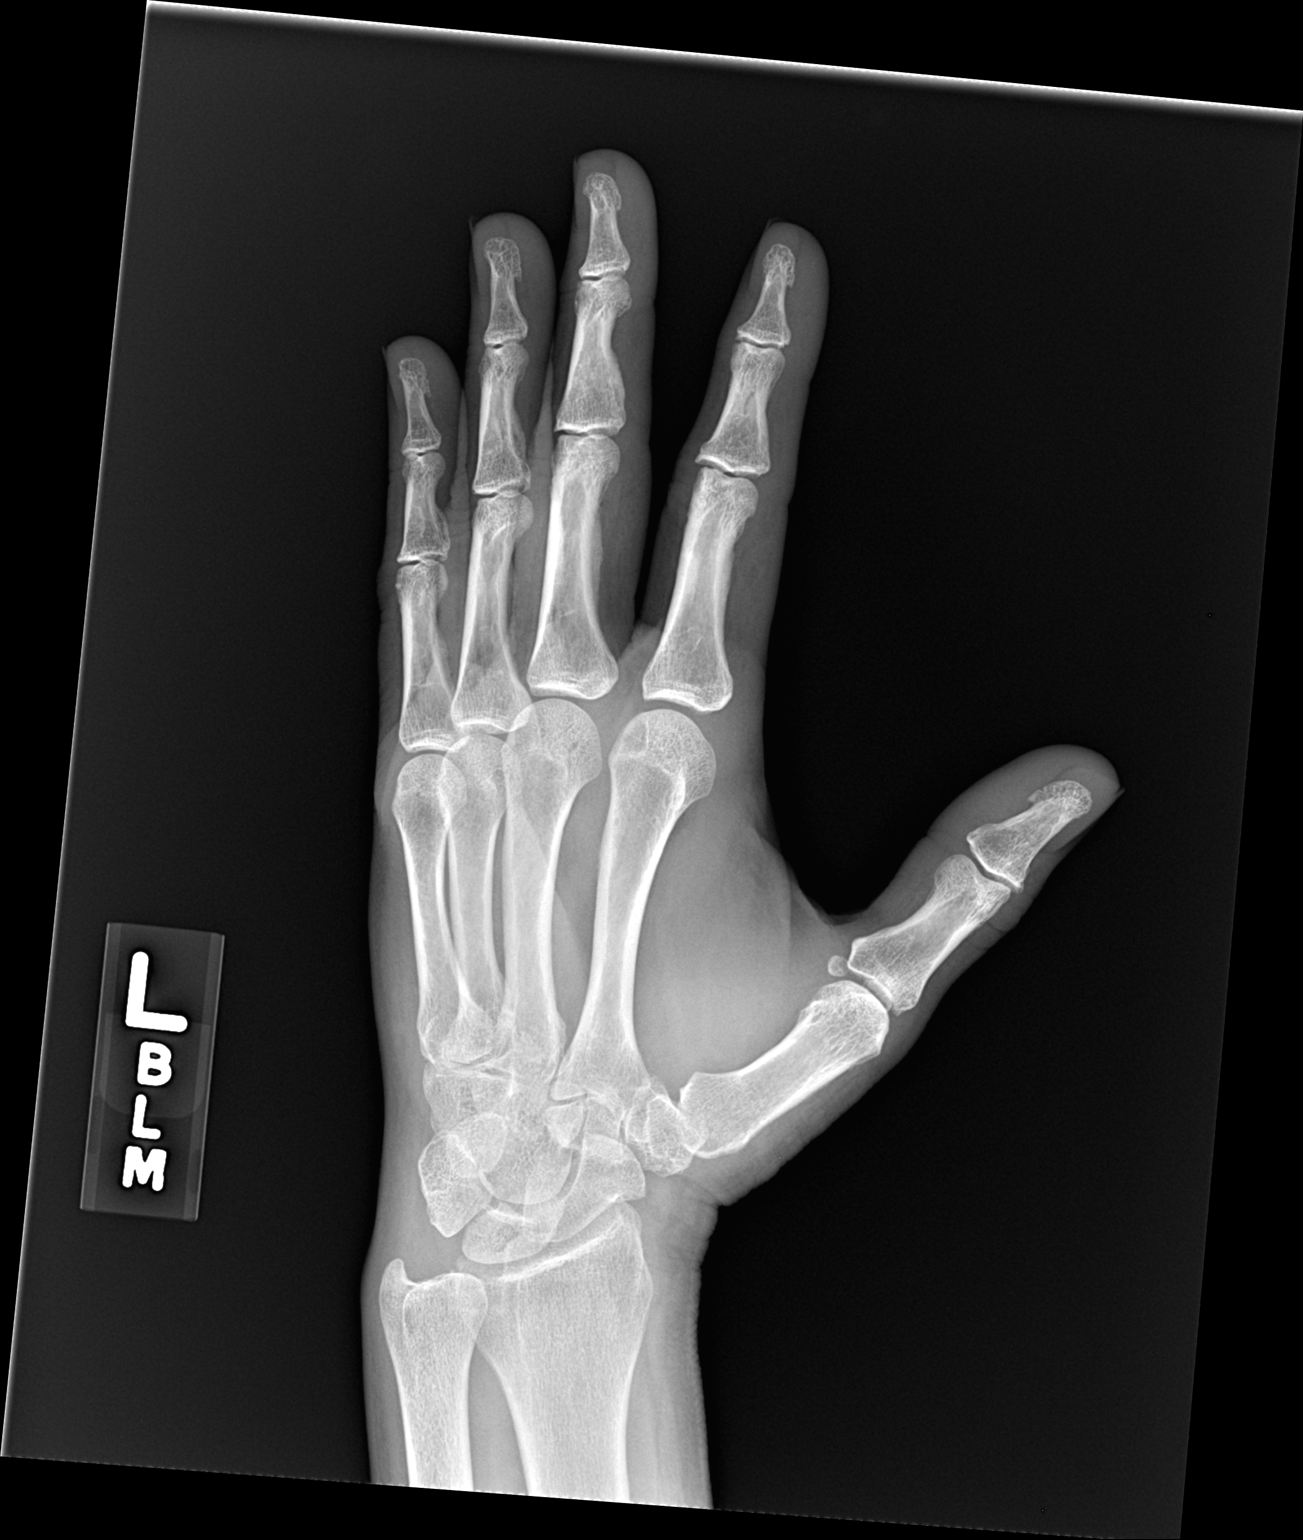

[hand lat]
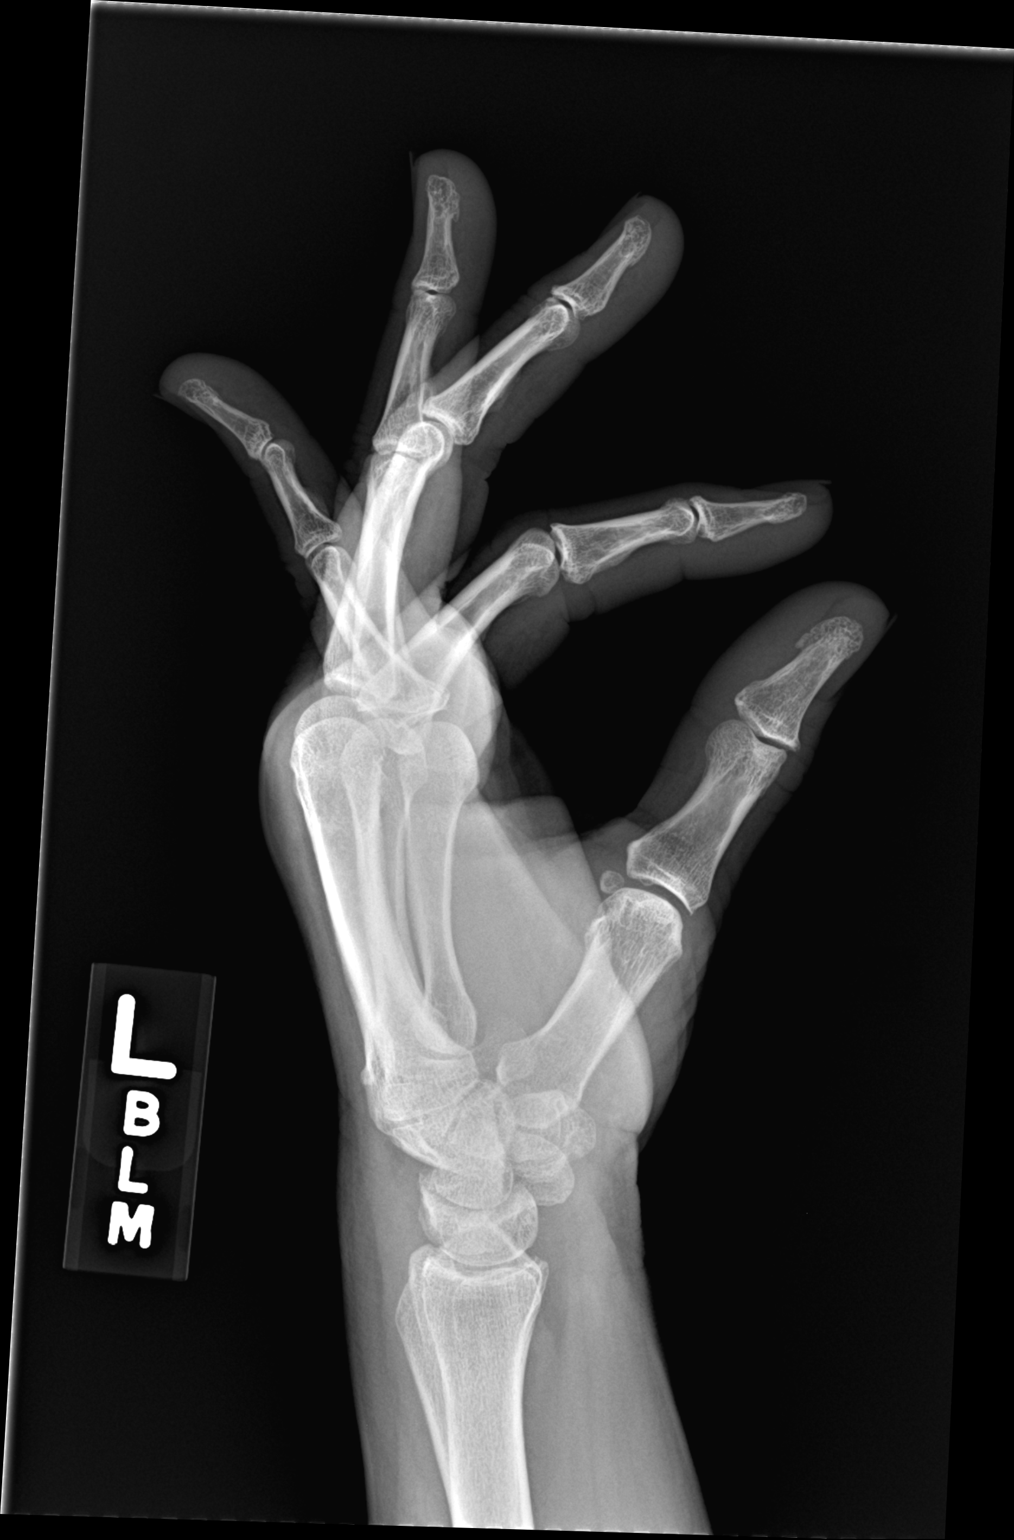

[2 of 2 positions shown; findings below may reference images not displayed]

01/10/21

EXAM

XR left hand, XR left wrist

INDICATION

Status post fall

TECHNIQUE

Three views of the left hand. Three views of the left wrist.

COMPARISONS

XR left hand and wrist 11/28/2019

FINDINGS

Small sagittally oriented nondisplaced fracture suspected at the distal ulna and correlate with
point tenderness. Fracture possibly extends into the ulnar styloid groove. Alternatively, this may
be projectional artifact however correlate with clinical exam. Normal alignment of the carpus. Trace
cortical thickening versus periosteal reaction along the ulnar aspect of the distal radial
diaphysis, which may be sequelae of prior injury.

IMPRESSION
1. Consideration for small sagittally nondisplaced distal ulnar fracture. Correlate with point
tenderness.

Tech Notes:

lt hand, wrist, and elbow pain after fall, pain, some abrasions, no other trauma or surgery hx

## 2021-01-10 IMAGING — CR WRISTCMLT
2 series · 2 of 2 positions shown · non-contrast
Comparison: none

[wrist obl]
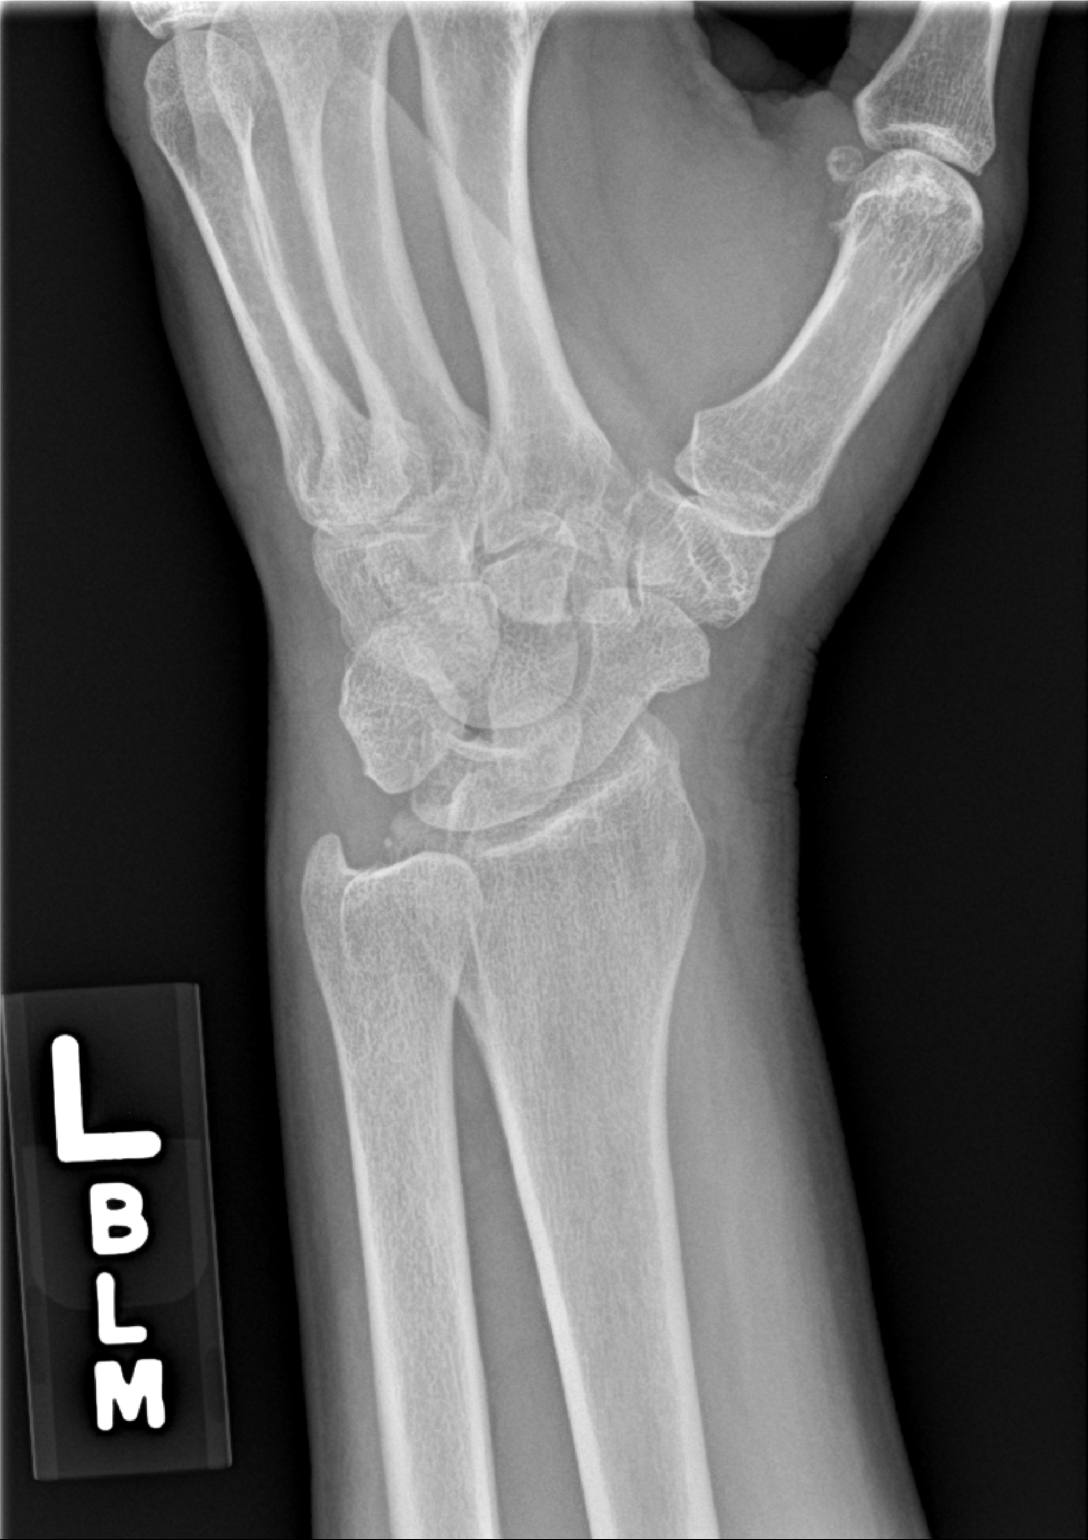

[wrist pa]
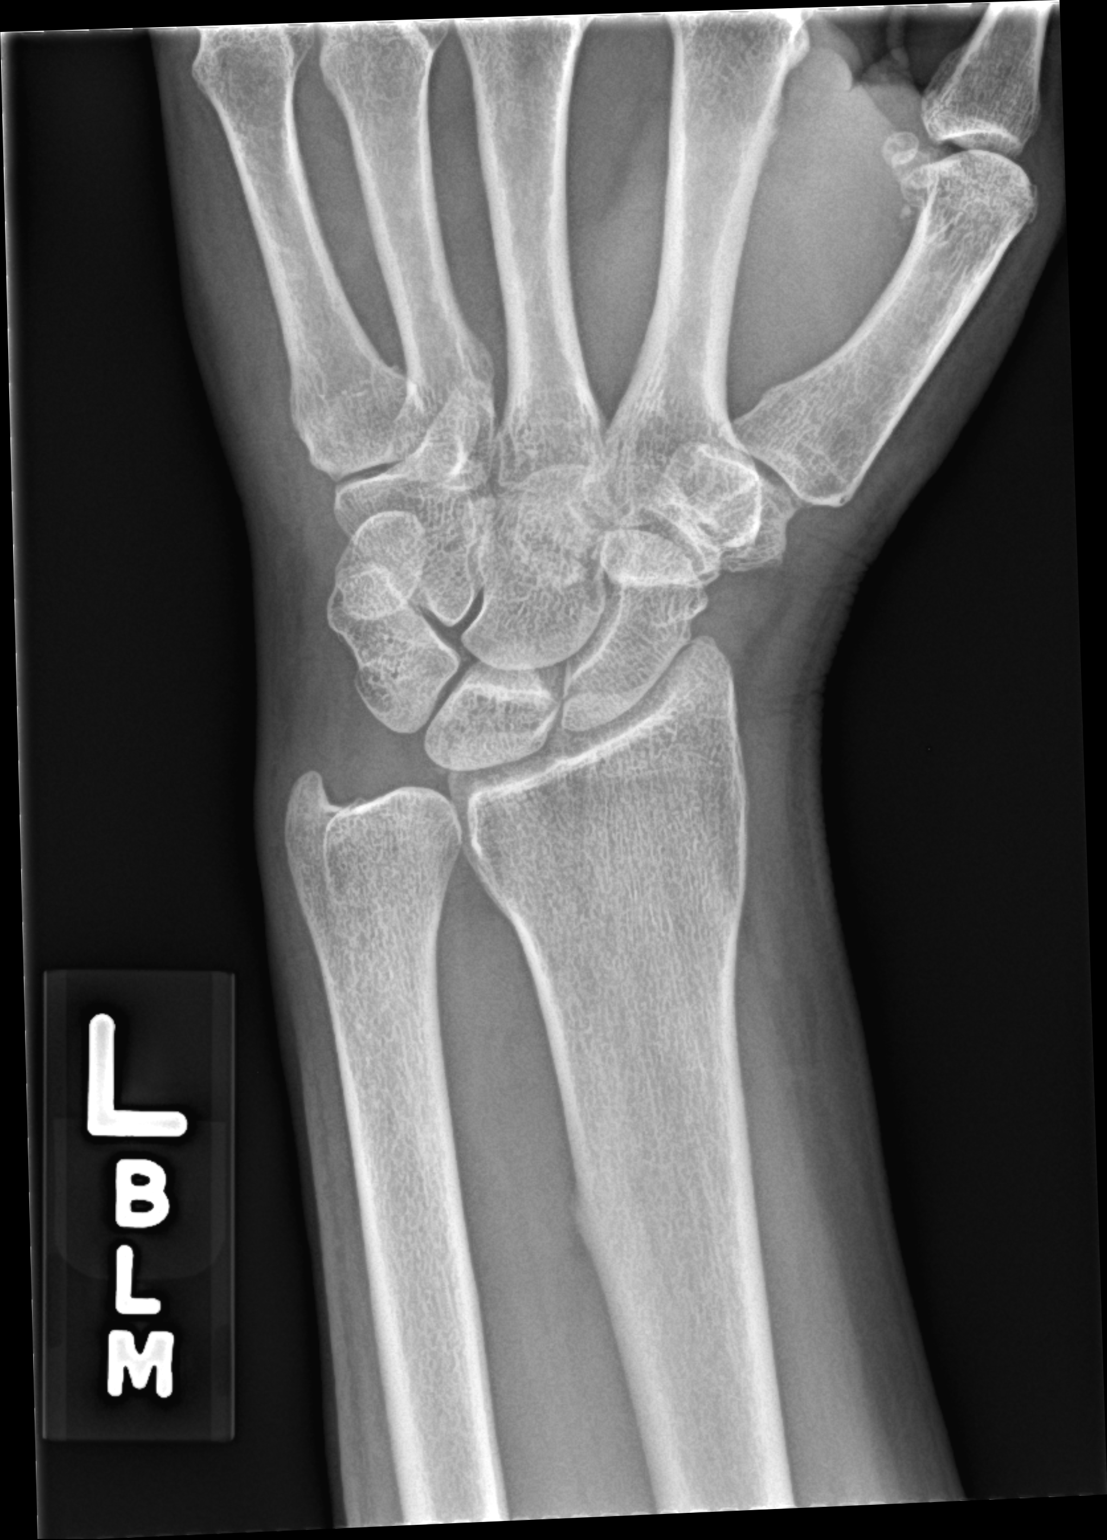

[2 of 2 positions shown; findings below may reference images not displayed]

01/10/21

EXAM

XR left hand, XR left wrist

INDICATION

Status post fall

TECHNIQUE

Three views of the left hand. Three views of the left wrist.

COMPARISONS

XR left hand and wrist 11/28/2019

FINDINGS

Small sagittally oriented nondisplaced fracture suspected at the distal ulna and correlate with
point tenderness. Fracture possibly extends into the ulnar styloid groove. Alternatively, this may
be projectional artifact however correlate with clinical exam. Normal alignment of the carpus. Trace
cortical thickening versus periosteal reaction along the ulnar aspect of the distal radial
diaphysis, which may be sequelae of prior injury.

IMPRESSION
1. Consideration for small sagittally nondisplaced distal ulnar fracture. Correlate with point
tenderness.

Tech Notes:

lt hand, wrist, and elbow pain after fall, pain, some abrasions, no other trauma or surgery hx

## 2021-01-14 ENCOUNTER — Encounter: Admit: 2021-01-14 | Discharge: 2021-01-14

## 2021-01-15 ENCOUNTER — Encounter: Admit: 2021-01-15 | Discharge: 2021-01-15

## 2021-01-27 ENCOUNTER — Encounter: Admit: 2021-01-27 | Discharge: 2021-01-27

## 2021-01-27 DIAGNOSIS — E1165 Type 2 diabetes mellitus with hyperglycemia: Secondary | ICD-10-CM

## 2021-01-27 MED ORDER — ATORVASTATIN 40 MG PO TAB
ORAL_TABLET | Freq: Every day | 0 refills
Start: 2021-01-27 — End: ?

## 2021-01-28 ENCOUNTER — Encounter: Admit: 2021-01-28 | Discharge: 2021-01-28

## 2021-01-28 NOTE — Telephone Encounter
Left a voicemail to call back.  Kerin Salen, RN

## 2021-01-28 NOTE — Telephone Encounter
Patient fainted 3 weeks ago due to not eating enough. Two weeks ago she went to the ER in [place] and she was scheduled with Dr. Damita Dunnings an orthopedic surgeon. She followed up and is wearing a brace for carpal tunnel and the fall. She reports being in a lot of pain and rates it at 10 continuously. She reports there are a few days the pain will go down to a 2-3.  She is scheduled for an MRI tomorrow morning and was advised to call Dr. Candiss Norse about the pain medication request. She has tried oxycodone 5 mg, tylenol, ibuprofen, naproxen and ice without relief. Records have been requested. Kerin Salen, RN     Requested pharmacy: [facility] in [place]

## 2021-01-28 NOTE — Telephone Encounter
Per Dr. Candiss Norse an appointment is recommended. Patient stated understanding and that she will go to an urgent care tomorrow for evaluation of the pain.   Kerin Salen, RN

## 2021-01-29 IMAGING — MR Wrist^ROUTINE
3 series · 5 of 5 positions shown · non-contrast
Comparison: none

[Series 6: STIR · coronal · 3.0mm · 0.39mm/px · 3 of 3 slices shown]
[im 1/3]
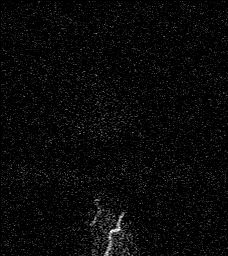
[im 2/3]
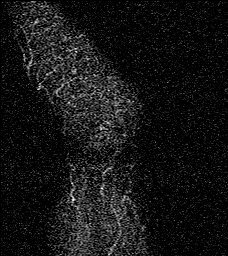
[im 3/3]
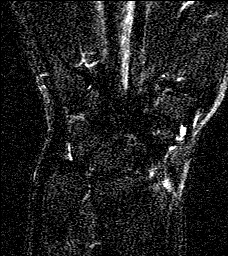

[Series 8: T1 · axial · 3.0mm · 0.23mm/px · 1 of 1 slices shown]
[im 1/1]
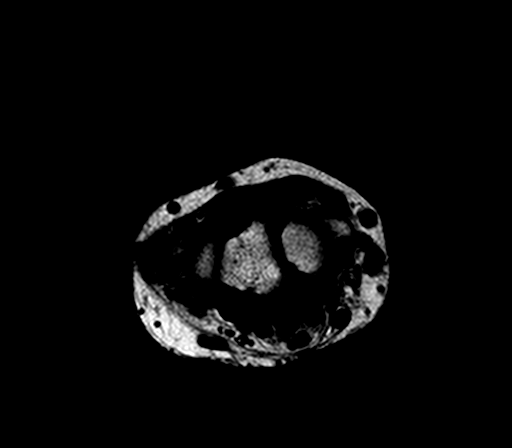

[Series 9: T2 fat-sat · axial · 3.0mm · 0.23mm/px · 1 of 1 slices shown]
[im 1/1]
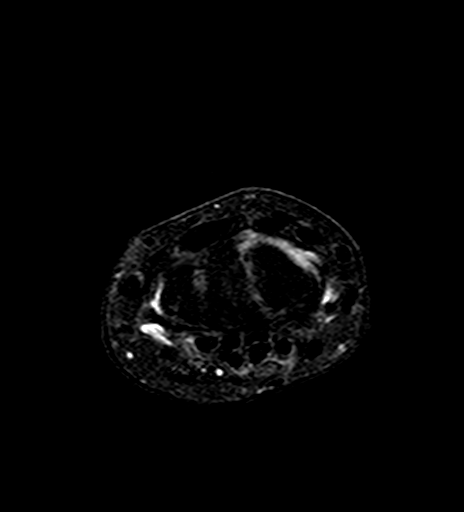

[5 of 5 positions shown; findings below may reference images not displayed]

01/29/21

DIAGNOSTIC STUDIES

EXAM

Left wrist MRI.

INDICATION

left wrist pain
left wrist pain s/p injury x1 year ago, hx recent injury to wrist x3 weeks ago, pain is along thumb
side

TECHNIQUE

Noncontrast left wrist MRI protocol.

COMPARISONS

January 10, 2021 left wrist radiographs.

FINDINGS

Marrow/joint space: There is a ganglion cyst at the palmar aspect of the distal radius which
measures up to 15 x 5 x 6 millimeters in dimension. No fracture or bone edema. Mild marginal
spurring of the triscaphe articulation with minimal subchondral cystic change. There is minimal
marginal spurring of the radiocarpal articulation without high-grade chondrosis. No aggressive
osseous lesion. Mild marginal spurring of the 1st carpometacarpal articulation without significant
edema.

Tendons: Mild asymmetric tenosynovial fluid is present around the flexor tendons of the wrist
suggesting mild tenosynovitis. The extensor tendons are intact without tear, tendinopathy, or
tenosynovitis.

Ligaments: The scapholunate and lunotriquetral ligaments are intact.

TFCC: The articular disc of the triangular fibrocartilage complex is attenuated centrally and
proximally suggesting degeneration/central perforation. The ulnar styloid and foveal ligaments are
intact.

There is trace edematous signal in the thenar eminence musculature without evidence of tear. No
other abnormal fluid collection.

IMPRESSION

Mild degenerative changes are present. There is a ganglion cyst at the palmar aspect of the distal
radius. There is evidence of mild tenosynovitis of the flexor tendons of the wrist although there is
no evidence of tendon tear.

Tech Notes:

left wrist pain s/p injury x1 year ago, hx recent injury to wrist x3 weeks ago, pain is along thumb
side

## 2021-02-07 ENCOUNTER — Encounter: Admit: 2021-02-07 | Discharge: 2021-02-07

## 2021-02-07 DIAGNOSIS — E1165 Type 2 diabetes mellitus with hyperglycemia: Secondary | ICD-10-CM

## 2021-02-07 MED ORDER — ATORVASTATIN 40 MG PO TAB
ORAL_TABLET | Freq: Every day | 0 refills | Status: AC
Start: 2021-02-07 — End: ?

## 2021-03-01 ENCOUNTER — Encounter: Admit: 2021-03-01 | Discharge: 2021-03-01

## 2021-03-01 DIAGNOSIS — I10 Essential (primary) hypertension: Secondary | ICD-10-CM

## 2021-03-01 MED ORDER — LISINOPRIL 20 MG PO TAB
ORAL_TABLET | Freq: Every day | 0 refills
Start: 2021-03-01 — End: ?

## 2021-03-01 MED ORDER — GLIMEPIRIDE 4 MG PO TAB
ORAL_TABLET | Freq: Two times a day (BID) | 0 refills
Start: 2021-03-01 — End: ?

## 2021-03-01 MED ORDER — HYDROCHLOROTHIAZIDE 25 MG PO TAB
ORAL_TABLET | Freq: Every day | 0 refills
Start: 2021-03-01 — End: ?

## 2021-03-05 ENCOUNTER — Encounter: Admit: 2021-03-05 | Discharge: 2021-03-05

## 2021-03-05 NOTE — Telephone Encounter
Patient called and she is seeing a hand specialist for this already and was seen in ER as well. Advised then the specialist would need to prescribe this for her if they felt necessary

## 2021-03-05 NOTE — Telephone Encounter
Patient called and states that she has fallen a few times and hurt her hand and needs refill of her Oxycodone but would like a higher dose than 5 mg as it really isn't helping much

## 2021-06-18 ENCOUNTER — Encounter: Admit: 2021-06-18 | Discharge: 2021-06-18

## 2021-08-07 ENCOUNTER — Encounter: Admit: 2021-08-07 | Discharge: 2021-08-07

## 2021-12-16 ENCOUNTER — Encounter: Admit: 2021-12-16 | Discharge: 2021-12-16

## 2022-04-09 IMAGING — CR [ID]
2 series · 2 of 2 positions shown · non-contrast
Comparison: none

[w chest pa]
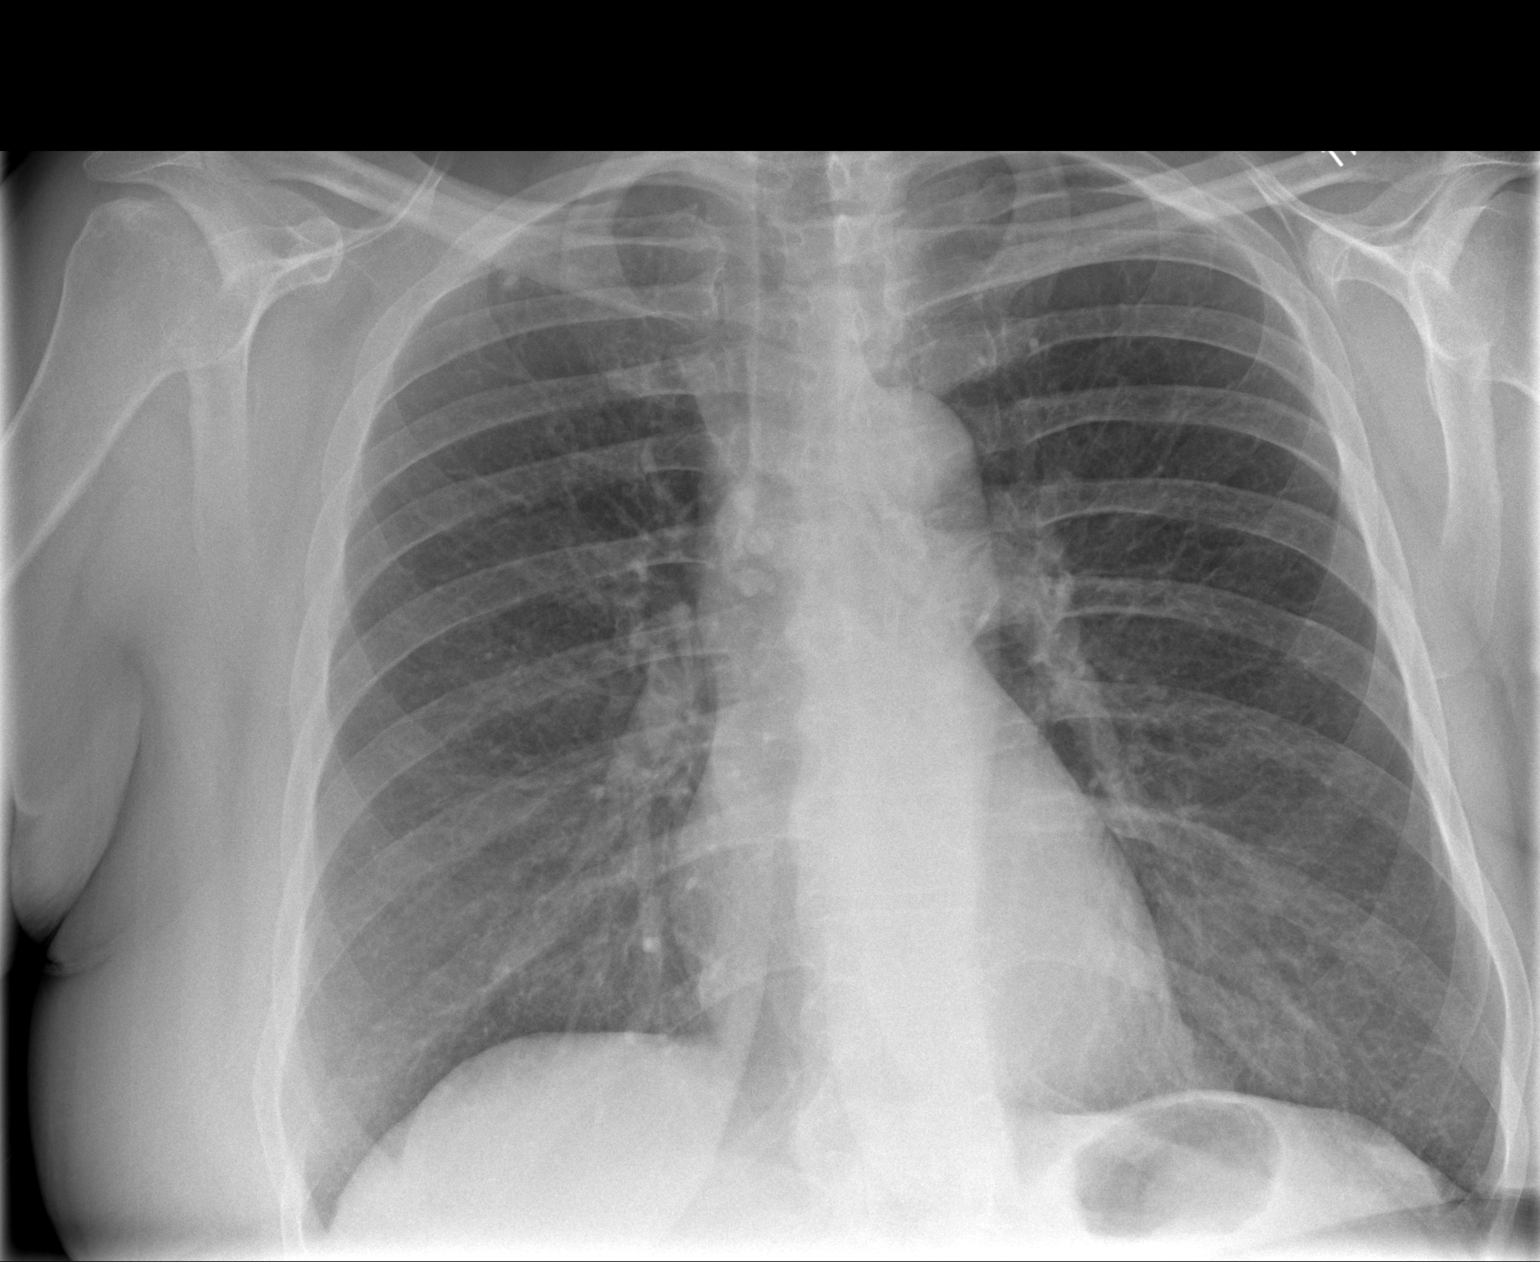

[w chest lat]
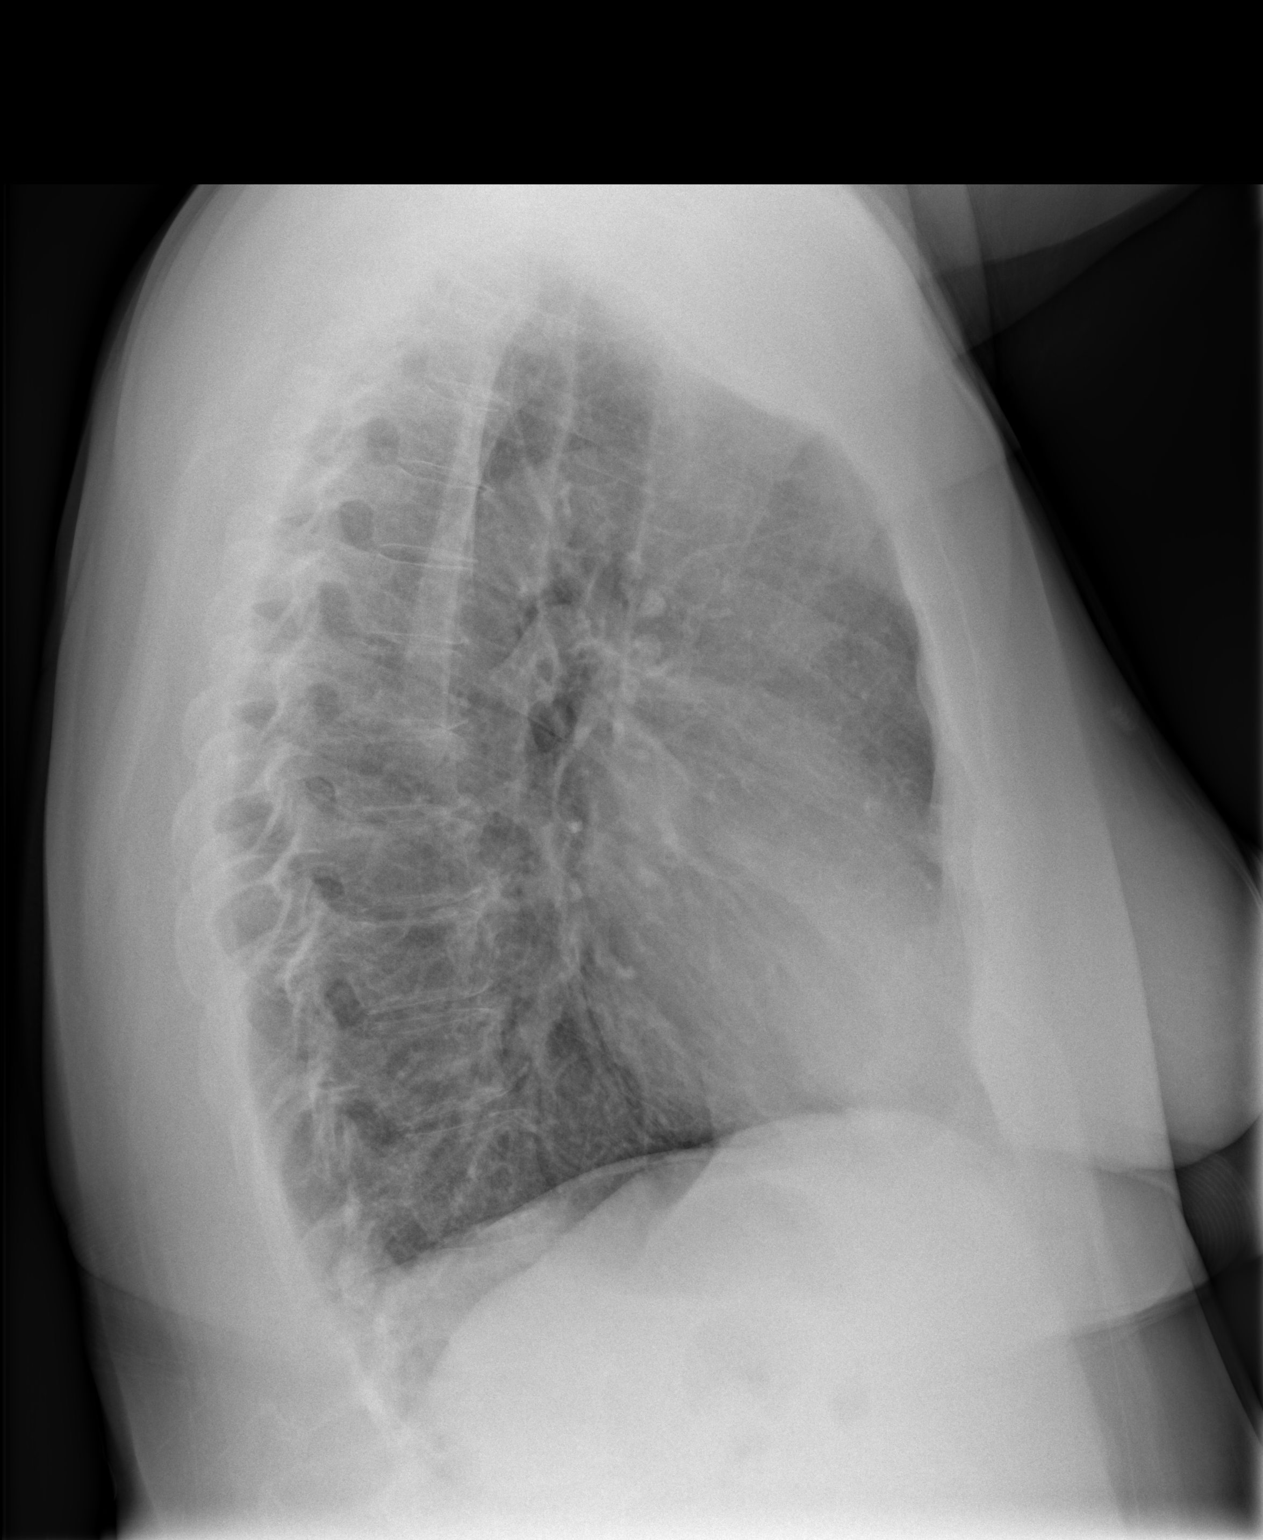

[2 of 2 positions shown; findings below may reference images not displayed]

DIAGNOSTIC STUDIES

EXAM

XR chest 2V

INDICATION

pain
Pt c/o chest pain and cough. JT

TECHNIQUE

PA and lateral views of the chest.

COMPARISONS

None available

FINDINGS

Cardiomediastinal silhouette is within normal limits. Lungs are clear. Postop changes of the
cervical spine are partially imaged. Calcified mediastinal nodes are evident. Osseous structures are
unremarkable.

IMPRESSION

No acute infiltrates throughout chest.

Tech Notes:

Pt c/o chest pain and cough. JT

## 2022-04-14 IMAGING — MR SPCERVWW
8 of 11 series · 26 of 48 positions shown · non-contrast
Comparison: none

[Series 5: T2 · sagittal · 3.0mm · 0.69mm/px · 2 of 17 slices shown (1 of 2)]
[im 1/17]
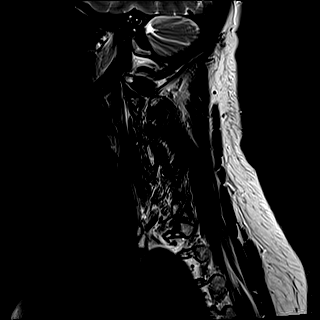
[im 17/17]
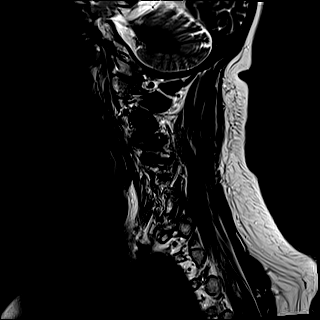

[Series 6: T1 · sagittal · 3.0mm · 0.43mm/px · 2 of 17 slices shown (1 of 2)]
[im 1/17]
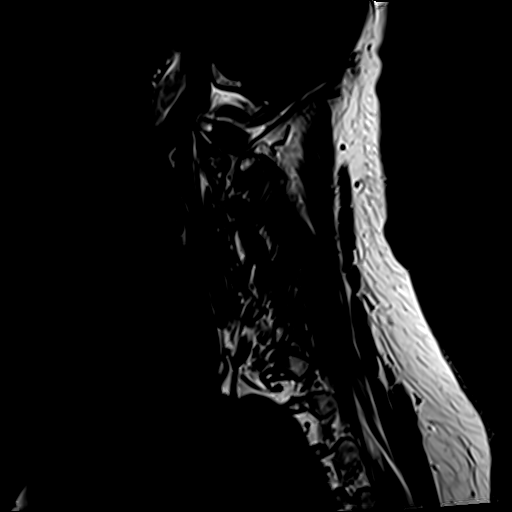
[im 17/17]
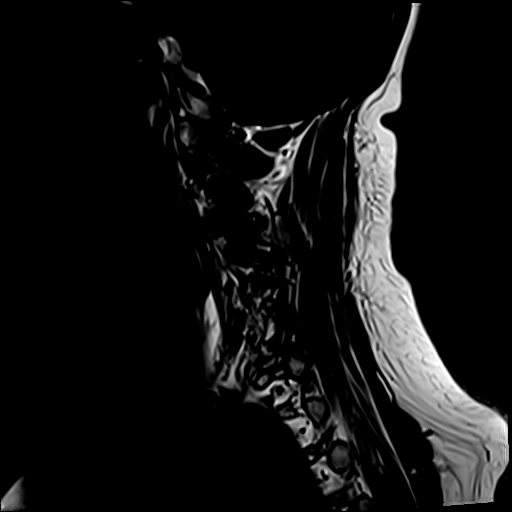

[Series 7: STIR · sagittal · 3.0mm · 0.86mm/px · 2 of 17 slices shown]
[im 1/17]
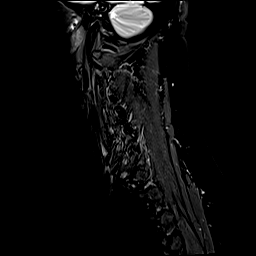
[im 17/17]
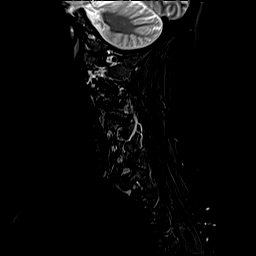

[Series 8: T2 · axial · 3.0mm · 0.70mm/px · z∈[-140,-33]mm · 5 of 35 slices shown (2 of 2)]
[im 1/35]
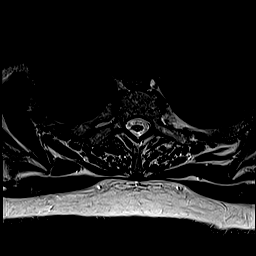
[im 9/35]
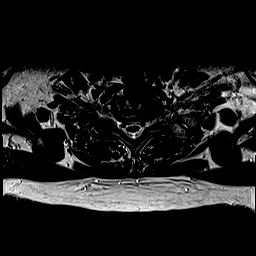
[im 18/35]
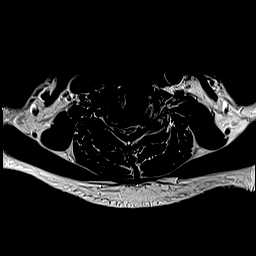
[im 26/35]
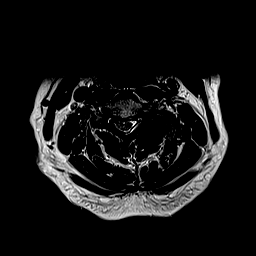
[im 35/35]
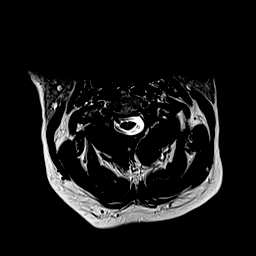

[Series 9: GRE · axial · 3.0mm · 0.47mm/px · z∈[-140,-87]mm · 3 of 35 slices shown]
[im 1/35]
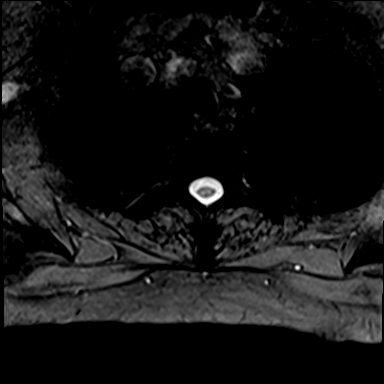
[im 9/35]
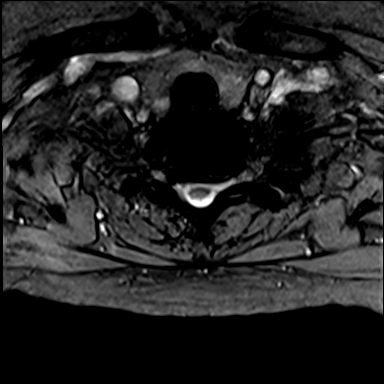
[im 18/35]
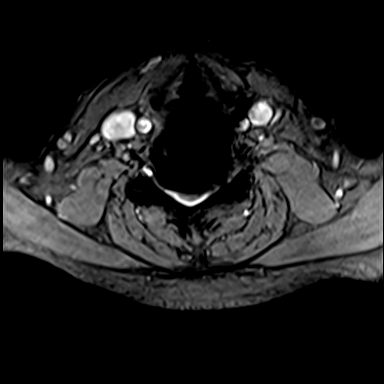

[Series 10: T1 · axial · 3.0mm · 0.35mm/px · z∈[-140,-33]mm · 5 of 35 slices shown (2 of 2)]
[im 1/35]
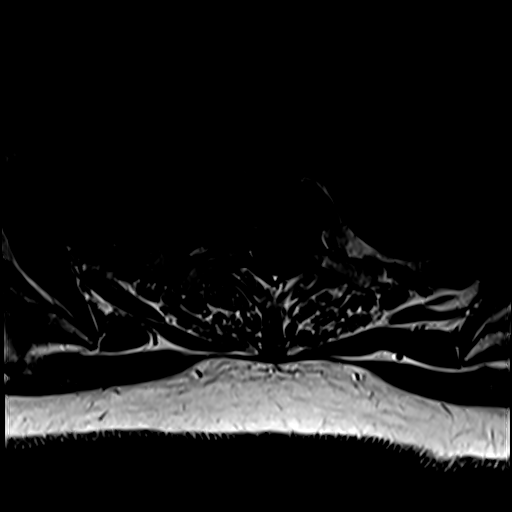
[im 9/35]
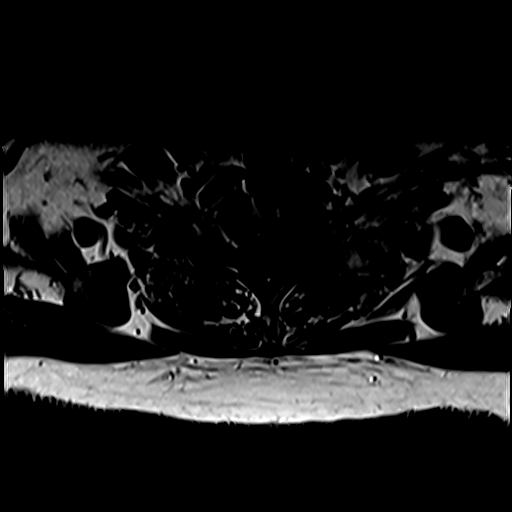
[im 18/35]
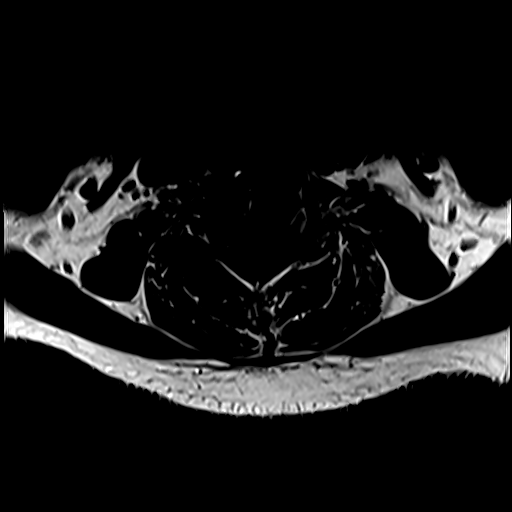
[im 26/35]
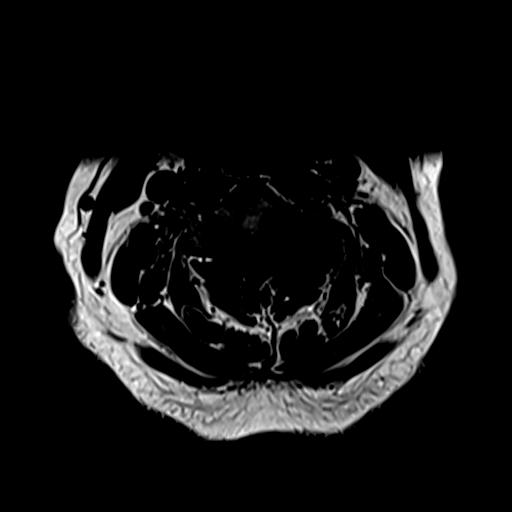
[im 35/35]
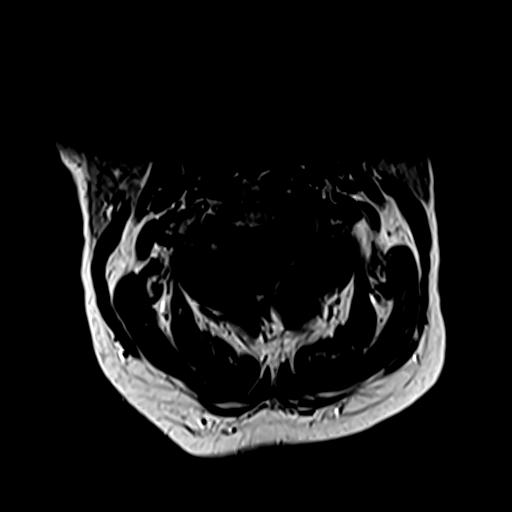

[Series 11: T1 post-contrast · sagittal · 3.0mm · 0.43mm/px · 2 of 17 slices shown]
[im 1/17]
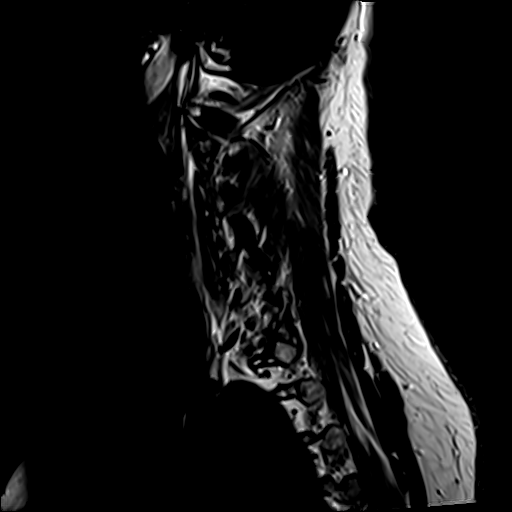
[im 17/17]
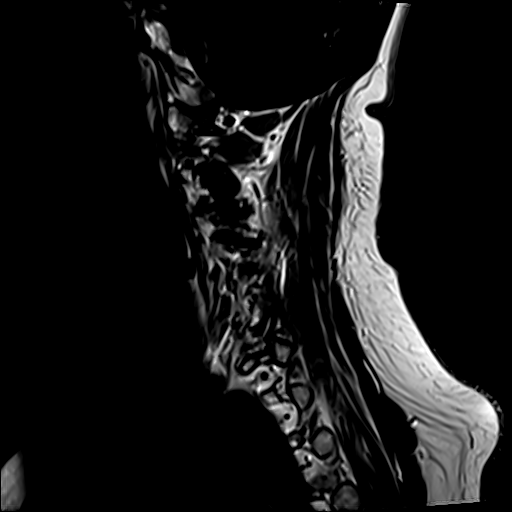

[Series 12: T1 fat-sat post-contrast · axial · 3.0mm · 0.35mm/px · z∈[-147,-40]mm · 5 of 35 slices shown]
[im 1/35]
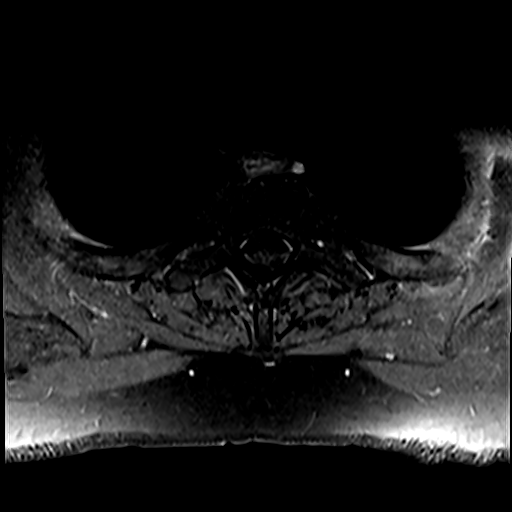
[im 9/35]
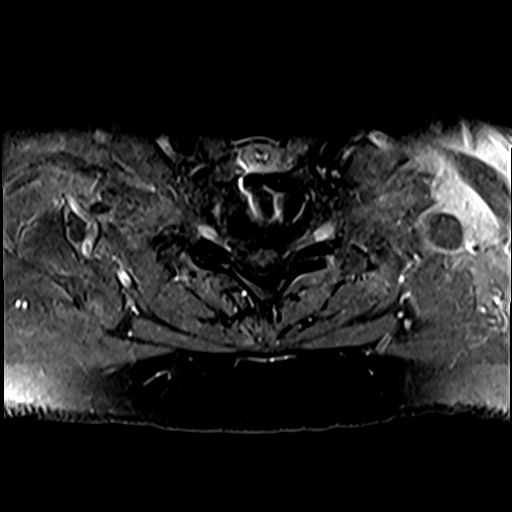
[im 18/35]
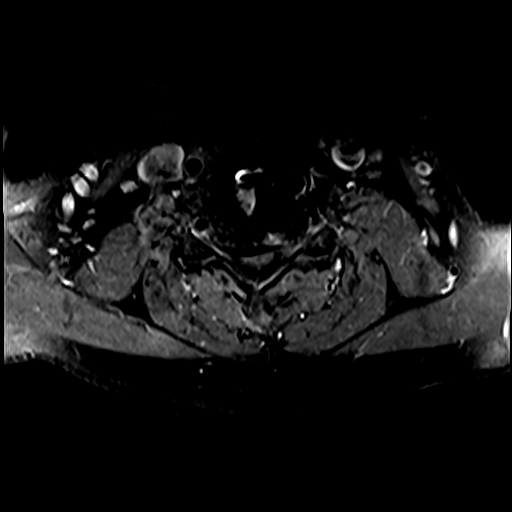
[im 26/35]
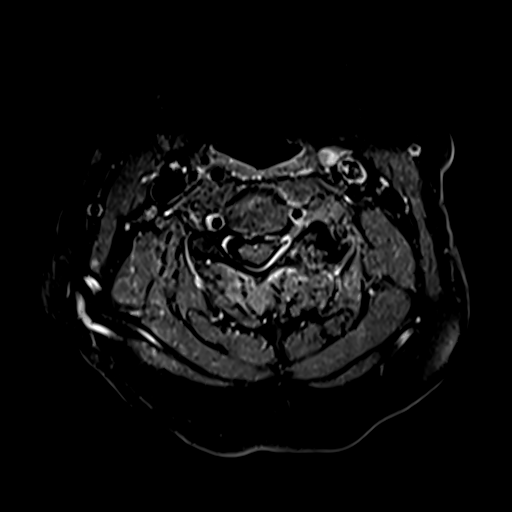
[im 35/35]
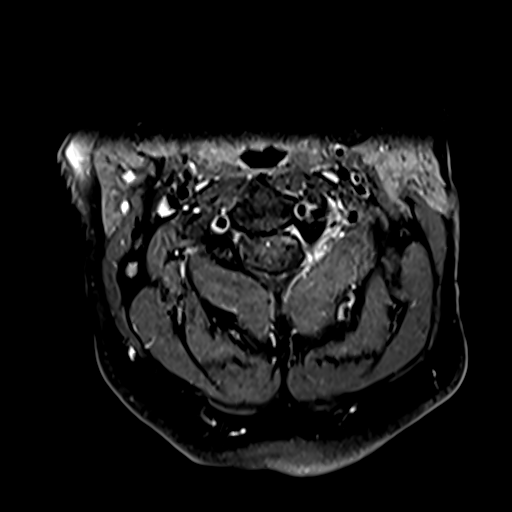

[26 of 48 positions shown; findings below may reference images not displayed]

DIAGNOSTIC STUDIES

EXAM

MRI of the cervical spine with and without contrast.

INDICATION

pain
DDD, STENOSIS, LEFT LEG NUMBNESS X 1 MONTH, TINGLING TO LEFT TOES.  HX SX TO ANTERIOR C SPINE.  10
ML GADAVIST RG

TECHNIQUE

Sagittal axial images were obtained with variable T1 and T2 weighting before after administration of
gadolinium.

COMPARISONS

None available

FINDINGS

Patient is status post anterior screw and plate fixation from C5 through C7 with apparent at least
partial resection of C6. Correlation with surgical history is recommended.

There are no abnormal signal properties of the visualized brainstem. T2 signal is noted within the
central cord at C6 extending for total length of approximately 8-9 millimeters. No enhancement is
seen in this location. Findings are consistent with focal myelomalacia change.

Mild degenerative changes are noted at C1-2.

C2-3: Minimal disc osteophyte complex is noted without central canal or neural foraminal stenosis.

C3-4: Disc osteophyte complex results in mild central canal narrowing. There is moderate to severe
left bony neural foraminal stenosis.

C4-5: Minimal disc osteophyte complex is seen. There is moderate to severe left bony neural
foraminal stenosis.

C5-6: There is loss of height with disc osteophyte complex and slight reversal cervical lordosis.
There is moderate to severe central canal stenosis. Moderate bilateral bony neural foraminal
stenosis is seen.

C6-7: Central canal is well preserved at this level. No significant neural foraminal stenosis is
seen.

C7-T1 mild disc osteophyte complex is seen without central canal or neural foraminal stenosis.

IMPRESSION

Extensive postoperative changes from C 5 through C7 as described. Correlation with prior surgical
history is recommended.

Spondylitic changes throughout the cervical spine resulting in varying degrees of central canal and
neural foraminal stenosis. Please see above discussion for individual levels.

Minimal linear T2 signal within the central cord at C6 consistent with myelomalacic change.

Tech Notes:

DDD, STENOSIS, LEFT LEG NUMBNESS X 1 MONTH, TINGLING TO LEFT TOES.  HX SX TO ANTERIOR C SPINE.  10
ML GADAVIST RG

## 2022-05-01 ENCOUNTER — Encounter: Admit: 2022-05-01 | Discharge: 2022-05-01

## 2022-05-01 IMAGING — MR SPLUMBWO
9 of 12 series · 29 of 48 positions shown · non-contrast
Comparison: none

[Series 5: T2 · sagittal · 4.0mm · 0.68mm/px · 3 of 17 slices shown (1 of 4)]
[im 1/17]
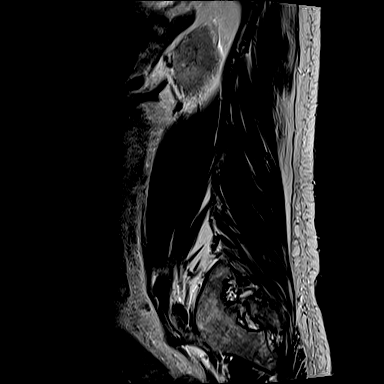
[im 9/17]
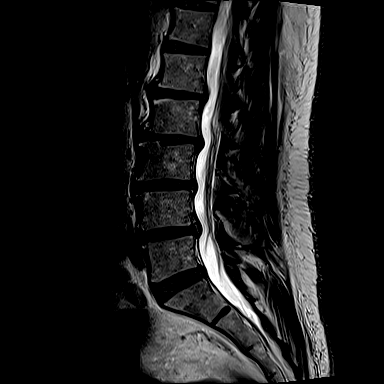
[im 17/17]
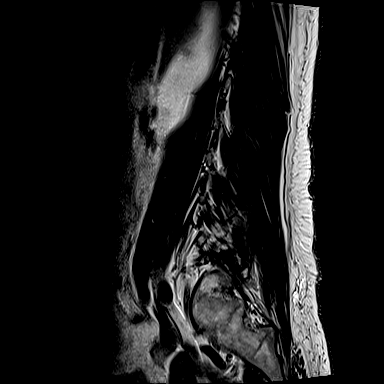

[Series 6: T1 · sagittal · 4.0mm · 0.81mm/px · 3 of 17 slices shown (1 of 4)]
[im 1/17]
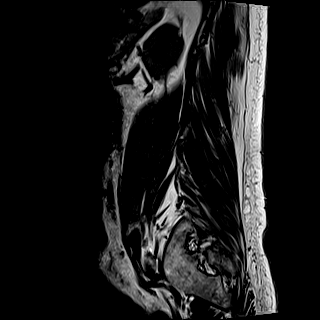
[im 9/17]
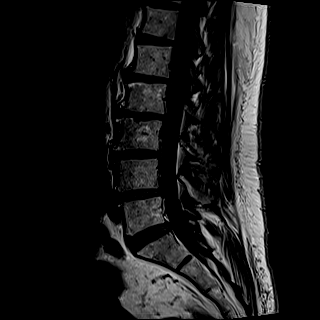
[im 17/17]
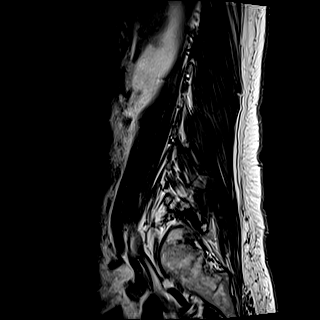

[Series 7: STIR · sagittal · 4.0mm · 0.51mm/px · 3 of 17 slices shown]
[im 1/17]
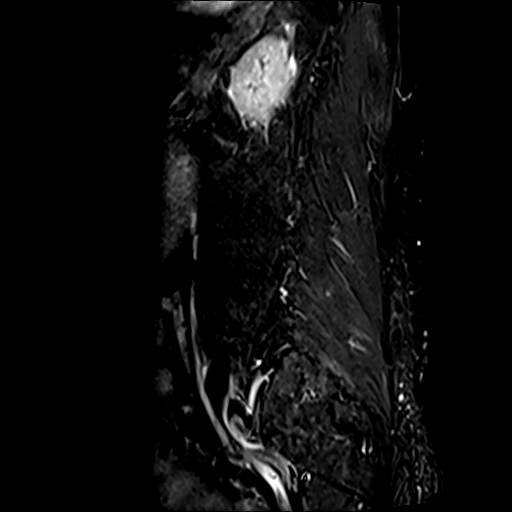
[im 9/17]
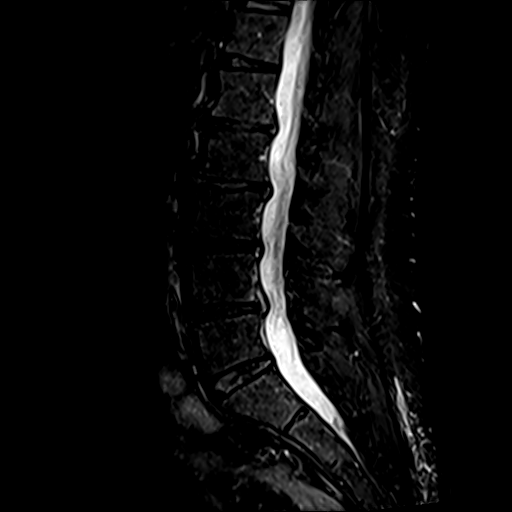
[im 17/17]
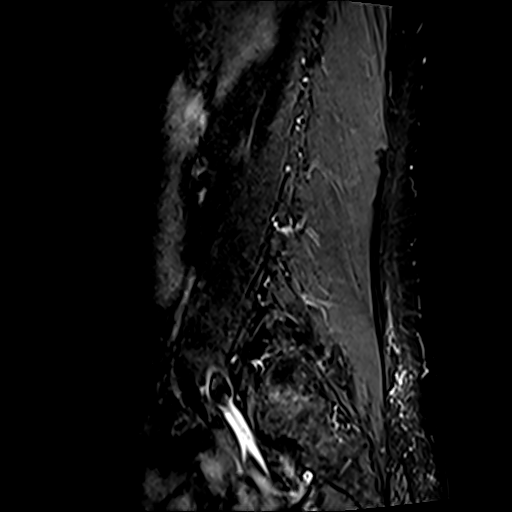

[Series 8: T2 · axial · 4.5mm · 0.81mm/px · z∈[+15,+160]mm · 4 of 28 slices shown (2 of 4)]
[im 1/28]
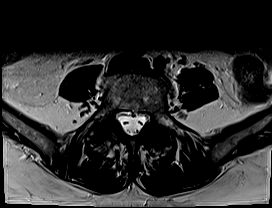
[im 10/28]
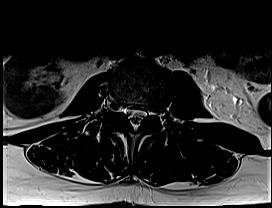
[im 19/28]
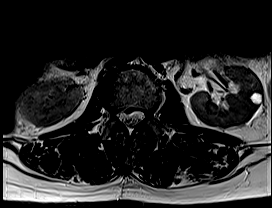
[im 28/28]
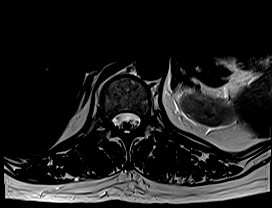

[Series 9: T2 · axial · 4.5mm · 0.81mm/px · 1 of 6 slices shown (3 of 4)]
[im 1/6]
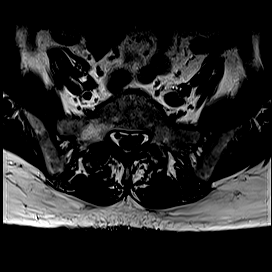

[Series 10: T2 · axial · 4.5mm · 0.81mm/px · z∈[-63,+160]mm · 5 of 34 slices shown (4 of 4)]
[im 1/34]
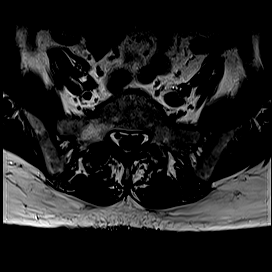
[im 9/34]
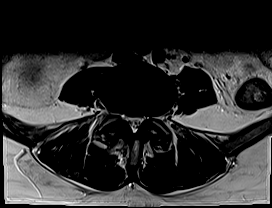
[im 17/34]
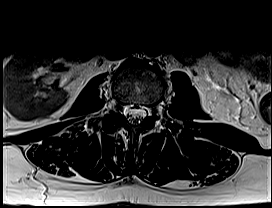
[im 25/34]
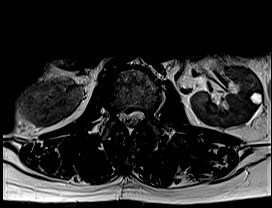
[im 34/34]
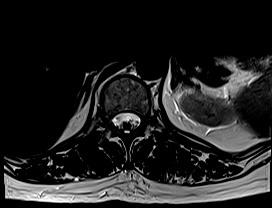

[Series 11: T1 · axial · 4.5mm · 0.43mm/px · z∈[+15,+160]mm · 4 of 28 slices shown (2 of 4)]
[im 1/28]
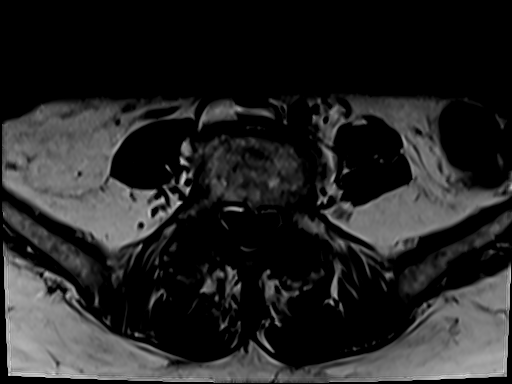
[im 10/28]
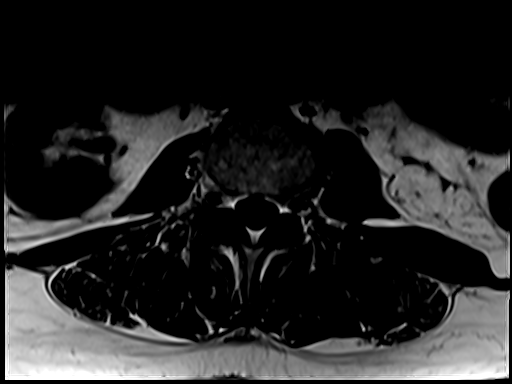
[im 19/28]
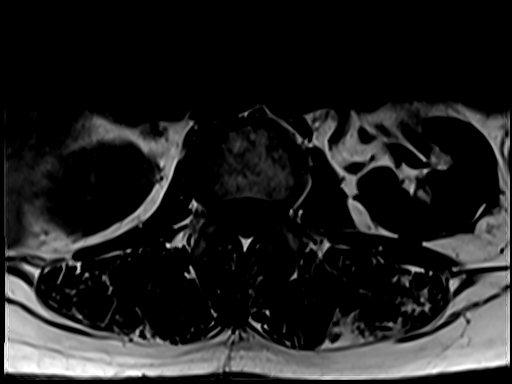
[im 28/28]
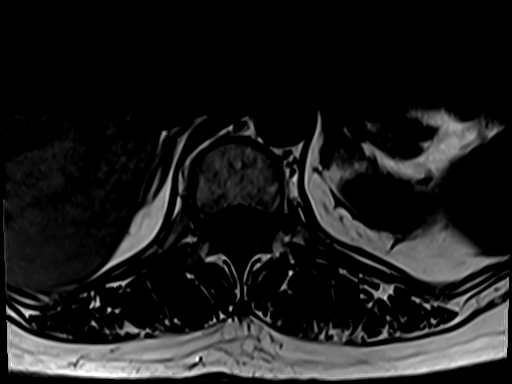

[Series 12: T1 · axial · 4.5mm · 0.43mm/px · 1 of 6 slices shown (3 of 4)]
[im 1/6]
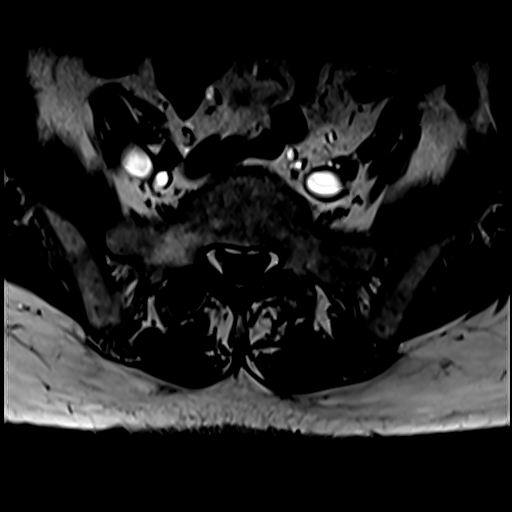

[Series 13: T1 · axial · 4.5mm · 0.43mm/px · z∈[-63,+160]mm · 5 of 34 slices shown (4 of 4)]
[im 1/34]
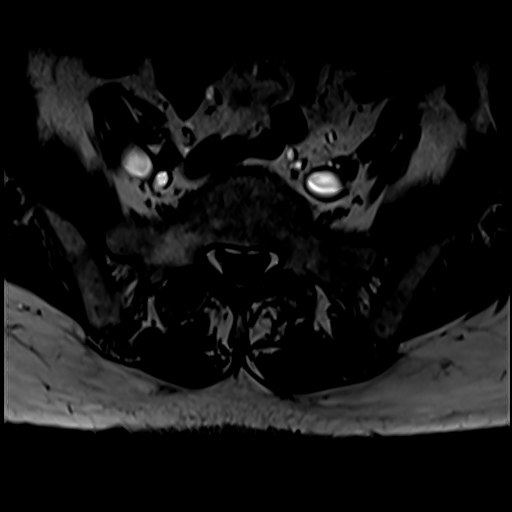
[im 9/34]
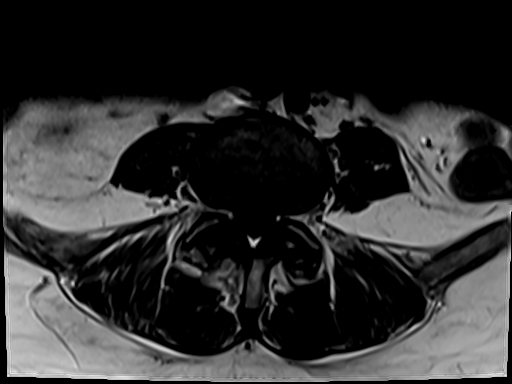
[im 17/34]
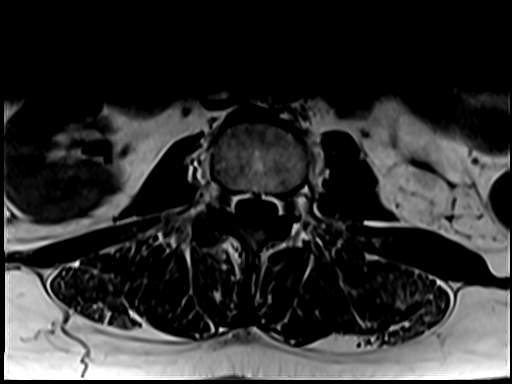
[im 25/34]
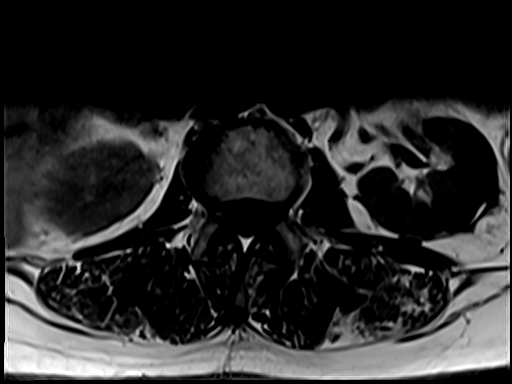
[im 34/34]
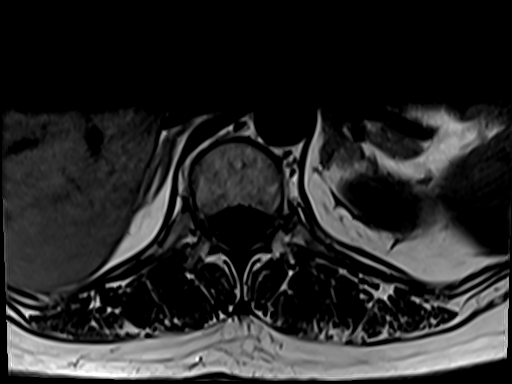

[29 of 48 positions shown; findings below may reference images not displayed]

DIAGNOSTIC STUDIES

EXAM

MR lumbar spine wo con

INDICATION

pain
CHRONIC PAIN TO LOW BACK STARTING AROUND 6953 WITH RADIATION DOWN LEFT LEG, NUMBNESS TO LEFT LATERAL
LOWER LEG.  RG

TECHNIQUE

Multiplanar multisequence MRI of the lumbar spine without IV contrast

COMPARISONS

None

FINDINGS

Normal cord and marrow signal. Conus medullaris tip terminates at L1.

T12-L1: Patent central canal and neural foramina.

L1-L2: Disc bulge with superimposed central disc extrusion. This causes mild central canal
narrowing. Neural foramina are patent.

L2-L3: Small disc bulge with mild central canal narrowing. Neural foramina are patent.

L3-L4: Small disc bulge with ligamentum flavum hypertrophy and mild central canal narrowing. There
is mild right and moderate left neural foraminal narrowing.

L4-L5: Small central disc protrusion and ligamentum flavum hypertrophy with moderate central canal
narrowing. Moderate right and moderate to severe left neural foraminal narrowing.

L5-S1: Minimal disc bulge. No central canal or neural foraminal stenosis.

Left renal upper pole 1 cm cyst. No acute findings in the extra vertebral soft tissues.

IMPRESSION

Multilevel degenerative changes as above, greatest at L4-L5 where disc protrusion causes moderate
central canal narrowing. Moderate to severe neural foraminal narrowing at this level.

L1-L2 disc extrusion with mild central canal narrowing.

Tech Notes:

CHRONIC PAIN TO LOW BACK STARTING AROUND 6953 WITH RADIATION DOWN LEFT LEG, NUMBNESS TO LEFT LATERAL
LOWER LEG.  RG

## 2022-05-13 ENCOUNTER — Encounter: Admit: 2022-05-13 | Discharge: 2022-05-13

## 2022-05-22 ENCOUNTER — Encounter: Admit: 2022-05-22 | Discharge: 2022-05-22

## 2022-05-22 NOTE — Telephone Encounter
LVM informing patient if any imaging done outside of [place] bring in a CD with report.

## 2022-05-27 ENCOUNTER — Encounter: Admit: 2022-05-27 | Discharge: 2022-05-27

## 2022-05-28 ENCOUNTER — Ambulatory Visit: Admit: 2022-05-28 | Discharge: 2022-05-29 | Payer: Medicaid Other

## 2022-05-28 ENCOUNTER — Encounter: Admit: 2022-05-28 | Discharge: 2022-05-28

## 2022-05-28 DIAGNOSIS — G959 Disease of spinal cord, unspecified: Secondary | ICD-10-CM

## 2022-05-28 DIAGNOSIS — K219 Gastro-esophageal reflux disease without esophagitis: Secondary | ICD-10-CM

## 2022-05-28 DIAGNOSIS — A64 Unspecified sexually transmitted disease: Secondary | ICD-10-CM

## 2022-05-28 DIAGNOSIS — M1990 Unspecified osteoarthritis, unspecified site: Secondary | ICD-10-CM

## 2022-05-28 DIAGNOSIS — T148XXA Other injury of unspecified body region, initial encounter: Secondary | ICD-10-CM

## 2022-05-28 DIAGNOSIS — M5416 Radiculopathy, lumbar region: Secondary | ICD-10-CM

## 2022-05-28 DIAGNOSIS — F410 Panic disorder [episodic paroxysmal anxiety] without agoraphobia: Secondary | ICD-10-CM

## 2022-05-28 DIAGNOSIS — E119 Type 2 diabetes mellitus without complications: Secondary | ICD-10-CM

## 2022-05-28 DIAGNOSIS — C539 Malignant neoplasm of cervix uteri, unspecified: Secondary | ICD-10-CM

## 2022-05-28 DIAGNOSIS — M25559 Pain in unspecified hip: Secondary | ICD-10-CM

## 2022-05-28 DIAGNOSIS — E785 Hyperlipidemia, unspecified: Secondary | ICD-10-CM

## 2022-05-28 DIAGNOSIS — G4733 Obstructive sleep apnea (adult) (pediatric): Secondary | ICD-10-CM

## 2022-05-28 DIAGNOSIS — R52 Pain, unspecified: Secondary | ICD-10-CM

## 2022-05-28 DIAGNOSIS — F429 Obsessive-compulsive disorder, unspecified: Secondary | ICD-10-CM

## 2022-05-28 DIAGNOSIS — R519 Generalized headaches: Secondary | ICD-10-CM

## 2022-05-28 DIAGNOSIS — M4800 Spinal stenosis, site unspecified: Secondary | ICD-10-CM

## 2022-05-28 DIAGNOSIS — M5136 Other intervertebral disc degeneration, lumbar region: Secondary | ICD-10-CM

## 2022-05-28 DIAGNOSIS — F419 Anxiety disorder, unspecified: Secondary | ICD-10-CM

## 2022-05-28 DIAGNOSIS — R292 Abnormal reflex: Secondary | ICD-10-CM

## 2022-05-28 DIAGNOSIS — M5030 Other cervical disc degeneration, unspecified cervical region: Secondary | ICD-10-CM

## 2022-05-28 DIAGNOSIS — M47812 Spondylosis without myelopathy or radiculopathy, cervical region: Secondary | ICD-10-CM

## 2022-05-28 DIAGNOSIS — R2681 Unsteadiness on feet: Secondary | ICD-10-CM

## 2022-05-28 DIAGNOSIS — I10 Essential (primary) hypertension: Secondary | ICD-10-CM

## 2022-05-28 DIAGNOSIS — F32A Depression: Secondary | ICD-10-CM

## 2022-05-28 MED ORDER — PREGABALIN 25 MG PO CAP
25 mg | ORAL_CAPSULE | Freq: Three times a day (TID) | ORAL | 1 refills | Status: AC
Start: 2022-05-28 — End: ?

## 2022-05-28 NOTE — Progress Notes
[facility] HISTORY AND PHYSICAL    Chief Complaint   Patient presents with   ? Neck - Pain   ? Lower Back - Pain   ? New Patient            HISTORY OF PRESENT ILLNESS:  [name] is a 56 year old female with history of cervical fusion, type 2 diabetes, obstructive sleep apnea, and hypertension, who presents for evaluation and treatment of low back pain.  Patient reports symptoms been present 0 to 6 months.  Pain located in the low back.  She has been given diagnosis of degenerative disc disease and spinal stenosis.  She denies specific injury.  This not related.  Temporally, pain is constant.  Quality pain is aching, nagging, throbbing, gnawing.  Pain is better with lying down.  Pain is worse with standing, walking, bending.  She denies radiation.  She does report numbness in the leg extending to the foot.  She reports weakness of the arm.  She denies weakness in the legs.  She denies loss of bilateral function.  Pain is not worse at night.  She denies unintentional weight loss.  VAS pain score is rated as a 2-6/10.  She has had MRI performed in [place].  She has had prior surgery.  Her greatest concern today is balance difficulty as well is radicular low back pain.         Medical History:   Diagnosis Date   ? Anxiety    ? Arthritis    ? 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 Chronic generalized pain    ? DDD (degenerative disc disease), cervical    ? Degenerative disc disease, cervical     Dont remember   ? Degenerative disc disease, lumbar     Dont remember   ? Depression     Depression and Anxiety   ? DM2 (diabetes mellitus, type 2) (HCC)    ? Generalized headaches    ? GERD (gastroesophageal reflux disease)    ? Hip pain    ? HLD (hyperlipidemia)    ? HTN (hypertension)    ? Nerve injury    ? OCD (obsessive compulsive disorder)    ? OSA (obstructive sleep apnea)    ? Panic attacks    ? Sexually transmitted disease     HX of HPV   ? Spinal stenosis     Dont remember       Surgical History: Procedure Laterality Date   ? TONSILLECTOMY  1973   ? CHOLECYSTECTOMY  1990   ? TUBAL LIGATION  1991   ? ANTERIOR CERVICAL 5-7 FUSION N/A 11/18/2015    Performed by Catarina Hartshorn[facility] at [facility]   ? VERTEBRECTOMY CERVICAL 6 N/A 11/18/2015    Performed by [facility]orn, [facility] at [facility]   ? HX TONSILLECTOMY      50 yrs ago   ? HX TOTAL ABDOMINAL HYSTERECTOMY      2/2 cervical ca       family history includes Alcohol liver disease in her father; Arthritis in her mother and sister; Cancer in her maternal grandfather; Diabetes in her maternal grandfather; Heart problem in her brother and father; Hypertension in her brother and mother; Joint Pain in her mother; Lung Disease in her brother.    Social History     Socioeconomic History   ? Marital status: Single   ? Number of children: 2   Tobacco Use   ?  Smoking status: Every Day     Packs/day: 2.00     Years: 45.00     Additional pack years: 0.00     Total pack years: 90.00     Types: Cigarettes   ? Smokeless tobacco: Never   ? Tobacco comments:     pt declined   Substance and Sexual Activity   ? Alcohol use: No   ? Drug use: No   ? Sexual activity: Not Currently     Partners: Male     Birth control/protection: Surgical     Comment: Hysterectomy   Social History Narrative    [place]  No DV.  Feels safe.  Currently not working.  On SSI               Allergies   Allergen Reactions   ? Pcn [Penicillins] ANAPHYLAXIS     Pt reports it'll kill me, rapid heart rate and I won't breathe.   ? Clindamycin EDEMA   ? Buspirone HEADACHE   ? Diclofenac SEE COMMENTS     Per patient does not remember reaction   ? Gabapentin ITCHING   ? Hydrocodone SEE COMMENTS     Pt states it makes me feel strange, like I'm out of control of myself.   ? Melatonin HEADACHE   ? Meloxicam SEE COMMENTS     Went to hospital for [facility] and rapid heart beat   ? Mirtazapine SEE COMMENTS     Medication interaction    ? Niacin SEE COMMENTS     Pt states I get really really hot and I feel strange.   ? Trazodone HEADACHE and DIZZINESS   ? Wellbutrin [Bupropion] ITCHING         Current Outpatient Medications:   ?  acetaminophen (TYLENOL) 325 mg tablet, Take 2 Tabs by mouth every 4 hours as needed., Disp: , Rfl: 0  ?  albuterol sulfate (PROAIR HFA) 90 mcg/actuation HFA aerosol inhaler, Inhale two puffs by mouth into the lungs every 6 hours as needed for Wheezing or Shortness of Breath. Shake well before use., Disp: 25.5 g, Rfl: 3  ?  atorvastatin (LIPITOR) 40 mg tablet, TAKE 1 TABLET BY MOUTH EVERY DAY AT BEDTIME, Disp: 90 tablet, Rfl: 0  ?  blood sugar diagnostic test strip, Use one strip as directed before meals and at bedtime., Disp: 300 strip, Rfl: 3  ?  Blood-Glucose Meter kit, Use as directed,, Disp: 1 kit, Rfl: 0  ?  clonazePAM (KLONOPIN) 1 mg tablet, Take one tablet by mouth three times daily., Disp: , Rfl:   ?  cyclobenzaprine (FLEXERIL) 10 mg tablet, Take one tablet by mouth at bedtime as needed for Muscle Cramps. Indications: muscle spasm, Disp: 30 tablet, Rfl: 0  ?  dapagliflozin (FARXIGA) 10 mg tablet, Take one tablet by mouth daily., Disp: 90 tablet, Rfl: 3  ?  duloxetine DR (CYMBALTA) 60 mg capsule, TAKE 2 CAPSULES BY MOUTH ONCE DAILY, Disp: 60 capsule, Rfl: 0  ?  ERGOcalciferoL (vitamin D2) (DRISDOL) 1,250 mcg (50,000 unit) capsule, Take one capsule by mouth every 7 days., Disp: , Rfl:   ?  famotidine (HEARTBURN RELIEF (FAMOTIDINE)) 10 mg tablet, Take one tablet by mouth twice daily., Disp: , Rfl:   ?  FLORAJEN WOMEN 15 billion cell cap, Take 1 capsule by mouth daily., Disp: , Rfl:   ?  fluticasone propionate (FLONASE) 50 mcg/actuation nasal spray, suspension, USE 1 SPRAY(S) IN EACH NOSTRIL TWICE DAILY AS DIRECTED.  SHAKE  BOTTLE  GENTLY  BEFORE  USING., Disp: 16 g, Rfl: 0  ?  glimepiride (AMARYL) 4 mg tablet, TAKE 1 TABLET BY MOUTH TWICE DAILY WITH MEALS, Disp: 180 tablet, Rfl: 0  ?  hydroCHLOROthiazide (HYDRODIURIL) 25 mg tablet, TAKE 1 TABLET BY MOUTH ONCE DAILY IN THE MORNING, Disp: 90 tablet, Rfl: 0  ?  lancets MISC, Use one each as directed three times daily before meals. Diag: DM2, Disp: 300 each, Rfl: 3  ?  lisinopriL (ZESTRIL) 20 mg tablet, Take 1 tablet by mouth once daily, Disp: 90 tablet, Rfl: 0  ?  metFORMIN (GLUCOPHAGE) 500 mg tablet, Take one tablet by mouth twice daily., Disp: , Rfl:   ?  Miscellaneous Medical Supply misc, 1 wrist splint for L wrist pain #1, 0 refills, Disp: 1 each, Rfl: 0  ?  naproxen (NAPROSYN) 500 mg tablet, Take one tablet by mouth twice daily with meals. Take with food., Disp: 60 tablet, Rfl: 3  ?  nicotine (NICODERM CQ STEP 1) 21 mg/day patch, Apply one patch to top of skin as directed every 24 hours. Rotate patch location.  Indications: stop smoking, Disp: 28 patch, Rfl: 0  ?  nicotine (NICODERM CQ STEP 2) 14 mg/day patch, Apply one patch to top of skin as directed every 24 hours. Rotate patch location.  Indications: stop smoking, Disp: 28 patch, Rfl: 0  ?  nicotine (NICODERM CQ STEP 3) 7 mg/day patch, Apply one patch to top of skin as directed every 24 hours. Rotate patch location.  Indications: stop smoking, Disp: 28 patch, Rfl: 0  ?  oxyCODONE (ROXICODONE) 5 mg tablet, Take one tablet by mouth every 4 hours as needed for Pain, Disp: 30 tablet, Rfl: 0  ?  pregabalin (LYRICA) 25 mg capsule, Take one capsule by mouth three times daily., Disp: 90 capsule, Rfl: 1  ?  TRULICITY 1.5 mg/0.5 mL injection pen, INJECT 1 SYRINGE SUBCUTANEOUSLY ONCE A WEEK, Disp: , Rfl:     Vitals:    05/28/22 1018   BP: 137/88   BP Source: Arm, Right Upper   Pulse: 90   SpO2: 99%   PainSc: Three   Weight: 78.9 kg (174 lb)   Height: 170.2 cm (5' 7)       Oswestry Total Score:: 40    No data recorded  Is a controlled substance agreement on file?No    Pain Score: Three    Body mass index is 27.25 kg/m?Marland Kitchen    Review of Systems         PHYSICAL EXAM:    General: 56 y.o. female appears stated age, in no acute distress  HEENT: Normocephalic, atraumatic  Neck: No thyroidmegaly  Cardiovascular: Well perfused  Pulmonary: Unlabored respirations  Extremities: No cyanosis, clubbing, or edema  Skin: Warm and dry  Psychiatric:  Appropriate mood and affect  Musculoskeletal: Limited range of motion with lumbar flexion, extension, and lateral rotation.  Tender to palpation at L4-L5 and L5-S1 facet joints. Facet loading is positive bilaterally.  FABER is negative bilaterally.    Neurologic: Bilateral lower extremity myotomes are all 4/5.  Lower extremity dermatomes are all intact to light touch.  Deep tendon reflexes are increased symmetric at patella and achilles.  Negative slump test bilaterally.   Positive Hoffmann's, right worse than left.  Ankle clonus, right worse than left.  Gait: Patient with myelopathic gait with poor base of support, overcorrection and lateral lean with slow cadence.    RADIOGRAPHIC EVALUATION:  MRI lumbar spine  from 05/01/2022 was personally reviewed and demonstrated large broad-based disc protrusion at L4-L5 combined with the ligamentum flavum hypertrophy and facet arthropathy resulting in at least moderate central canal stenosis and moderate bilateral neuroforaminal stenosis, slightly worse on the right.  At L3-L4 there is broad-based disc protrusion resulting in mild bilateral neural foraminal stenosis.  At L2-L3 there is broad-based disc protrusion eccentric to the right resulting in mild trefoil stenosis and mild to moderate right neural foraminal stenosis.  At L1-L2 there is a disc extrusion with cephalad migration causing mass effect on the ventral conus.    MRI of cervical spine from 04/14/2022 was personally viewed and demonstrated prior surgical changes with C6 corpectomy.  There is moderate central stenosis and evidence of chronic myelomalacia, although this is slightly improved since imaging in 2020 and 2016.    IMPRESSION:    1. Lumbar radiculopathy    2. Cervical spondylosis    3. Hyperreflexia    4. Chronic myelopathy (HCC)    5. Gait instability          PLAN:   1.  Lifestyle modification.  Recommend keeping spine in neutral position.  May consider gait aid to decrease fall risk.  2.  Medication.  We will provide a prescription for pregabalin 25 mg 3 times a day.  She has had itching with gabapentin, so we will start at low-dose and if she is able to tolerate, we will titrate appropriately.  If she is unable to tolerate, we will need to look at other options.  Of note, the patient is currently on duloxetine 120 mg a day.  3.  Therapy.  I would recommend formalized physical therapy for her low back.  I provided her with this today.  4.  Interventions.  I recommend an L4-L5 interlaminar epidural steroid injection for therapeutic benefit.  I anticipate that this will help both the L1-L2 and L4-L5 disc herniations.  We discussed risk and benefits of procedure including pain, bleeding, infection, damage to her structures.  She would like to proceed.  I would not recommend cervical epidural steroid injection at this time.  We could consider medial branch blocks either above or below the level of her prior surgery.  5.  Referral.  I would attempt to avoid surgery if at all possible.  I discussed that surgical intervention would likely not improve her gait or mobility given her examination today as well as her most recent MRI.  It does not appear that the T2 signals are acute and appear more chronic in nature.  6.  Follow-up.  Patient is to follow-up for procedure.    Thank you Dr. Thedore Mins for the opportunity to take part in the care of this very pleasant patient.  Please feel free to call with questions or concerns.

## 2022-05-28 NOTE — Patient Instructions
[facility]  Dr. McCasey Smith  General Instructions:  How to reach me: Please send a MyChart message to the [facility] or leave a voicemail for my nurse at [phone]  Fax number: [phone]  How to get a medication refill: Please use the MyChart Refill request or contact your pharmacy directly to request medication refills.  How to receive your test results: If you have signed up for MyChart, you will receive your test results and messages from me this way. Otherwise, you will get a phone call or letter. If you are expecting results and have not heard from my office within 2 weeks of your testing, please send a MyChart message or call my office.  Scheduling: Our scheduling phone number is [phone].  Support for many chronic illnesses is available through [facility]: [facility] or [phone].  For questions on nights, weekends or holidays, call the operator at [phone], and ask for the doctor on call for Physical Medicine and Rehab.  [email or web address] a comprehensive resource for understanding, preventing and seeking appropriate treatment for back and neck pain and related conditions.

## 2022-06-30 ENCOUNTER — Encounter: Admit: 2022-06-30 | Discharge: 2022-06-30

## 2022-07-07 ENCOUNTER — Encounter: Admit: 2022-07-07 | Discharge: 2022-07-07

## 2022-07-27 ENCOUNTER — Encounter: Admit: 2022-07-27 | Discharge: 2022-07-27

## 2023-11-04 ENCOUNTER — Encounter: Admit: 2023-11-04 | Discharge: 2023-11-04
